# Patient Record
Sex: Male | Born: 1940 | Race: Black or African American | Hispanic: No | Marital: Married | Smoking: Never smoker
Health system: Southern US, Community
[De-identification: ages and names within clinical notes are randomized; demographics above are authoritative.]

## PROBLEM LIST (undated history)

## (undated) DIAGNOSIS — M1990 Unspecified osteoarthritis, unspecified site: Secondary | ICD-10-CM

## (undated) DIAGNOSIS — I4891 Unspecified atrial fibrillation: Secondary | ICD-10-CM

## (undated) DIAGNOSIS — I10 Essential (primary) hypertension: Secondary | ICD-10-CM

## (undated) DIAGNOSIS — D497 Neoplasm of unspecified behavior of endocrine glands and other parts of nervous system: Secondary | ICD-10-CM

## (undated) DIAGNOSIS — K219 Gastro-esophageal reflux disease without esophagitis: Secondary | ICD-10-CM

## (undated) DIAGNOSIS — N400 Enlarged prostate without lower urinary tract symptoms: Secondary | ICD-10-CM

## (undated) HISTORY — DX: Benign prostatic hyperplasia without lower urinary tract symptoms: N40.0

## (undated) HISTORY — DX: Neoplasm of unspecified behavior of endocrine glands and other parts of nervous system: D49.7

## (undated) HISTORY — DX: Essential (primary) hypertension: I10

## (undated) HISTORY — DX: Unspecified atrial fibrillation: I48.91

## (undated) HISTORY — PX: VEIN SURGERY: SHX48

---

## 2004-02-07 HISTORY — PX: COLONOSCOPY: SHX5424

## 2004-08-27 HISTORY — PX: TUMOR REMOVAL: SHX12

## 2005-08-27 HISTORY — PX: NASAL POLYP SURGERY: SHX186

## 2008-04-29 ENCOUNTER — Ambulatory Visit (HOSPITAL_COMMUNITY): Admission: RE | Admit: 2008-04-29 | Discharge: 2008-04-29 | Payer: Self-pay | Admitting: Family Medicine

## 2008-05-07 ENCOUNTER — Ambulatory Visit (HOSPITAL_COMMUNITY): Admission: RE | Admit: 2008-05-07 | Discharge: 2008-05-07 | Payer: Self-pay | Admitting: Family Medicine

## 2008-05-18 ENCOUNTER — Ambulatory Visit: Payer: Self-pay | Admitting: Thoracic Surgery

## 2008-05-21 ENCOUNTER — Ambulatory Visit (HOSPITAL_COMMUNITY): Admission: RE | Admit: 2008-05-21 | Discharge: 2008-05-21 | Payer: Self-pay | Admitting: Otolaryngology

## 2011-01-09 NOTE — Letter (Signed)
May 18, 2008   Angus G. Renard Matter, MD  [address]  [place], [place] [address]   Re:  KHASIR, WOODROME             DOB:  [birth year]   Dear Thalia Party:   I appreciate the opportunity to see this 70 year old African American  male who is having a large left supraclavicular fossa tumor.  It is  nonpainful, but he says it has gotten larger over the last 3 years.  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  The CT scan also showed  that he had some type of nasal polyp and he has been having trouble with  sinusitis.   PAST MEDICAL HISTORY:  Significant for hypertension, for which he takes  Avalide 150/12.5 daily.   FAMILY HISTORY:  Noncontributory.   SOCIAL HISTORY:  Married.  He has 3 children.  He is retired.  Does not  smoke but did smoke in the past.   REVIEW OF SYSTEMS:  Noncontributory.   PHYSICAL EXAMINATION:  VITAL SIGNS:  He is 200 pounds, he is 6 feet,  blood pressure is 158/94, pulse 50, respiration 18, and sats were 96%.  HEAD, EYES, EARS, NOSE AND THROAT:  Unremarkable.  There is no increased  nasal drainage from particularly in his nares.  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  CHEST:  Clear to auscultation and percussion.  HEART:  Regular sinus rhythm.  No murmurs.  ABDOMEN:  Soft.  There is no hepatosplenomegaly.  EXTREMITIES:  Pulses are 2+.  There is no clubbing or edema.  NEUROLOGIC:  He is oriented x3.   I discussed the situation with the patient as well as one of his  relatives who is an MD at [facility] and suggested that he see Dr. Zola Button T.  Wolicki about both of the problems.  We could take care of the lipoma  and I do think it needs to be excised as it will just continue to get  bigger and will probably start causing symptoms.  However, his sinus  problems are such that he needs a full ENT evaluation and thus  both of  this can be taken care by Dr. Lazarus Salines.  I have tentatively referred him  to Dr. Lazarus Salines.  If Dr. Lazarus Salines wants me to excise the lipoma, I will be  happy to do it any time.   I appreciate the opportunity to see the patient.   Ines Bloomer, M.D.  Electronically Signed   DPB/MEDQ  D:  05/18/2008  T:  05/19/2008  Job:  [id number]

## 2011-12-25 DIAGNOSIS — D353 Benign neoplasm of craniopharyngeal duct: Secondary | ICD-10-CM

## 2011-12-25 DIAGNOSIS — D352 Benign neoplasm of pituitary gland: Secondary | ICD-10-CM | POA: Diagnosis not present

## 2011-12-25 DIAGNOSIS — E237 Disorder of pituitary gland, unspecified: Secondary | ICD-10-CM | POA: Diagnosis not present

## 2012-06-06 ENCOUNTER — Ambulatory Visit (HOSPITAL_COMMUNITY)
Admission: RE | Admit: 2012-06-06 | Discharge: 2012-06-06 | Disposition: A | Discharge status: Home / Self Care | Payer: Medicare Other | Source: Ambulatory Visit | Attending: Family Medicine | Admitting: Family Medicine

## 2012-06-06 ENCOUNTER — Other Ambulatory Visit (HOSPITAL_COMMUNITY): Payer: Self-pay | Admitting: Family Medicine

## 2012-06-06 DIAGNOSIS — M542 Cervicalgia: Secondary | ICD-10-CM | POA: Diagnosis not present

## 2012-06-06 DIAGNOSIS — M25519 Pain in unspecified shoulder: Secondary | ICD-10-CM | POA: Diagnosis not present

## 2012-06-06 DIAGNOSIS — I10 Essential (primary) hypertension: Secondary | ICD-10-CM | POA: Diagnosis not present

## 2012-06-06 DIAGNOSIS — R52 Pain, unspecified: Secondary | ICD-10-CM

## 2012-06-06 DIAGNOSIS — M4802 Spinal stenosis, cervical region: Secondary | ICD-10-CM | POA: Diagnosis not present

## 2012-08-27 HISTORY — PX: TUMOR REMOVAL: SHX12

## 2012-12-12 ENCOUNTER — Emergency Department (HOSPITAL_COMMUNITY)
Admission: EM | Admit: 2012-12-12 | Discharge: 2012-12-12 | Disposition: A | Discharge status: Home / Self Care | Payer: Medicare Other | Attending: Emergency Medicine | Admitting: Emergency Medicine

## 2012-12-12 DIAGNOSIS — R1033 Periumbilical pain: Secondary | ICD-10-CM | POA: Diagnosis not present

## 2012-12-12 DIAGNOSIS — R300 Dysuria: Secondary | ICD-10-CM | POA: Diagnosis not present

## 2012-12-12 DIAGNOSIS — Z79899 Other long term (current) drug therapy: Secondary | ICD-10-CM | POA: Diagnosis not present

## 2012-12-12 DIAGNOSIS — R339 Retention of urine, unspecified: Secondary | ICD-10-CM | POA: Diagnosis not present

## 2012-12-12 DIAGNOSIS — N390 Urinary tract infection, site not specified: Secondary | ICD-10-CM | POA: Diagnosis not present

## 2012-12-12 LAB — URINALYSIS, MICROSCOPIC ONLY
Bilirubin Urine: NEGATIVE
Nitrite: NEGATIVE
Specific Gravity, Urine: 1.025 (ref 1.005–1.030)
pH: 6 (ref 5.0–8.0)

## 2012-12-12 MED ORDER — CIPROFLOXACIN HCL 500 MG PO TABS: 500.0000 mg | ORAL_TABLET | Freq: Two times a day (BID) | ORAL | Status: DC

## 2012-12-12 NOTE — ED Notes (Signed)
Snack and fluids provided for patient.

## 2012-12-12 NOTE — ED Notes (Signed)
Patient instructed on leg bag use and emptying.

## 2012-12-12 NOTE — ED Notes (Addendum)
Pt reports urinary urgency, burning with urination and increased frequency, however is unable to pass urine.  Also reports global abdominal "soreness."  Reports sweating at night for the last 2 days.  Also describes pain that radiates to the bilateral flank areas.

## 2012-12-12 NOTE — ED Provider Notes (Signed)
History     CSN: [id number]  Arrival date & time 12/12/12  1219   First MD Initiated Contact with Patient 12/12/12 1345      Chief Complaint  Patient presents with  . Urinary Retention  . Abdominal Pain    (Consider location/radiation/quality/duration/timing/severity/associated sxs/prior treatment) Patient is a 72 y.o. male presenting with abdominal pain. The history is provided by the patient (pt states he cannot urinate and his abd hurts and is distented).  Abdominal Pain Pain location:  Periumbilical Pain quality: aching   Pain radiates to:  Does not radiate Pain severity:  Moderate Onset quality:  Gradual Chronicity:  New Context: not alcohol use   Associated symptoms: no chest pain, no cough, no diarrhea, no fatigue and no hematuria     No past medical history on file.  No past surgical history on file.  No family history on file.  History  Substance Use Topics  . Smoking status: Not on file  . Smokeless tobacco: Not on file  . Alcohol Use: Not on file      Review of Systems  Constitutional: Negative for appetite change and fatigue.  HENT: Negative for congestion, sinus pressure and ear discharge.   Eyes: Negative for discharge.  Respiratory: Negative for cough.   Cardiovascular: Negative for chest pain.  Gastrointestinal: Positive for abdominal pain. Negative for diarrhea.  Genitourinary: Positive for decreased urine volume and difficulty urinating. Negative for frequency and hematuria.  Musculoskeletal: Negative for back pain.  Skin: Negative for rash.  Neurological: Negative for seizures and headaches.  Psychiatric/Behavioral: Negative for hallucinations.    Allergies  Review of patient's allergies indicates no known allergies.  Home Medications   Current Outpatient Rx  Name  Route  Sig  Dispense  Refill  . docusate sodium (COLACE) 100 MG capsule   Oral   Take 100 mg by mouth 2 (two) times daily as needed for constipation.         .  hydrochlorothiazide (MICROZIDE) 12.5 MG capsule   Oral   Take 12.5 mg by mouth daily.         Marland Kitchen lisinopril (PRINIVIL,ZESTRIL) 10 MG tablet   Oral   Take 10 mg by mouth daily.         Marland Kitchen OVER THE COUNTER MEDICATION   Oral   Take 1 capsule by mouth 2 (two) times daily with a meal. [employer] Super [employer] Enzymes         . ciprofloxacin (CIPRO) 500 MG tablet   Oral   Take 1 tablet (500 mg total) by mouth 2 (two) times daily.   14 tablet   0     BP 150/110  Pulse 78  Temp(Src) 98.1 F (36.7 C) (Oral)  Ht 6' (1.829 m)  Wt 200 lb (90.719 kg)  BMI 27.12 kg/m2  SpO2 100%  Physical Exam  Constitutional: He is oriented to person, place, and time. He appears well-developed.  HENT:  Head: Normocephalic.  Eyes: Conjunctivae and EOM are normal. No scleral icterus.  Neck: Neck supple. No thyromegaly present.  Cardiovascular: Normal rate and regular rhythm.  Exam reveals no gallop and no friction rub.   No murmur heard. Pulmonary/Chest: No stridor. He has no wheezes. He has no rales. He exhibits no tenderness.  Abdominal: He exhibits no distension. There is tenderness. There is no rebound.  Abdomin distended and tender  Musculoskeletal: Normal range of motion. He exhibits no edema.  Lymphadenopathy:    He has no cervical adenopathy.  Neurological: He  is oriented to person, place, and time. Coordination normal.  Skin: No rash noted. No erythema.  Psychiatric: He has a normal mood and affect. His behavior is normal.    ED Course  Procedures (including critical care time)  Labs Reviewed  URINALYSIS, MICROSCOPIC ONLY - Abnormal; Notable for the following:    Color, Urine STRAW (*)    Hgb urine dipstick LARGE (*)    Protein, ur 100 (*)    Urobilinogen, UA 2.0 (*)    Squamous Epithelial / LPF FEW (*)    Casts WBC CAST (*)    All other components within normal limits   No results found.   1. UTI (lower urinary tract infection)   2. Urinary retention       MDM  Urinary  retention        Benny Lennert, MD 12/12/12 [phone]

## 2012-12-12 NOTE — ED Notes (Signed)
The patient states that he is having difficulty urinating for the past 2 days, reports global abdominal soreness as well.

## 2012-12-12 NOTE — ED Notes (Signed)
Pt appears in no acute distress, reports pressure relief in bladder area with insertion of catheter.

## 2012-12-14 LAB — URINE CULTURE: Colony Count: 15000

## 2012-12-15 ENCOUNTER — Other Ambulatory Visit (HOSPITAL_COMMUNITY): Payer: Self-pay | Admitting: Urology

## 2012-12-15 ENCOUNTER — Telehealth (HOSPITAL_COMMUNITY): Payer: Self-pay | Admitting: Emergency Medicine

## 2012-12-15 DIAGNOSIS — R339 Retention of urine, unspecified: Secondary | ICD-10-CM | POA: Diagnosis not present

## 2012-12-15 NOTE — ED Notes (Signed)
+  Urine. Patient treated with Cipro. Sensitive to same. Per protocol MD. °

## 2012-12-15 NOTE — ED Notes (Signed)
Patient has +Urine culture. °

## 2012-12-17 ENCOUNTER — Ambulatory Visit (HOSPITAL_COMMUNITY)
Admission: RE | Admit: 2012-12-17 | Discharge: 2012-12-17 | Disposition: A | Discharge status: Home / Self Care | Payer: Medicare Other | Source: Ambulatory Visit | Attending: Urology | Admitting: Urology

## 2012-12-17 DIAGNOSIS — Q619 Cystic kidney disease, unspecified: Secondary | ICD-10-CM | POA: Insufficient documentation

## 2012-12-17 DIAGNOSIS — R339 Retention of urine, unspecified: Secondary | ICD-10-CM | POA: Diagnosis not present

## 2012-12-17 DIAGNOSIS — N281 Cyst of kidney, acquired: Secondary | ICD-10-CM | POA: Diagnosis not present

## 2012-12-29 DIAGNOSIS — N390 Urinary tract infection, site not specified: Secondary | ICD-10-CM | POA: Diagnosis not present

## 2012-12-29 DIAGNOSIS — N400 Enlarged prostate without lower urinary tract symptoms: Secondary | ICD-10-CM | POA: Diagnosis not present

## 2012-12-29 DIAGNOSIS — R339 Retention of urine, unspecified: Secondary | ICD-10-CM | POA: Diagnosis not present

## 2013-02-02 DIAGNOSIS — N400 Enlarged prostate without lower urinary tract symptoms: Secondary | ICD-10-CM | POA: Diagnosis not present

## 2013-02-04 NOTE — Patient Instructions (Addendum)
PATIENT INSTRUCTIONS POST-ANESTHESIA  IMMEDIATELY FOLLOWING SURGERY:  Do not drive or operate machinery for the first twenty four hours after surgery.  Do not make any important decisions for twenty four hours after surgery or while taking narcotic pain medications or sedatives.  If you develop intractable nausea and vomiting or a severe headache please notify your doctor immediately.  FOLLOW-UP:  Please make an appointment with your surgeon as instructed. You do not need to follow up with anesthesia unless specifically instructed to do so.  WOUND CARE INSTRUCTIONS (if applicable):  Keep a dry clean dressing on the ane   Travis Santos  02/04/2013   Your procedure is scheduled on:   02/10/2013  Report to [facility] at  745  AM.  Call this number if you have problems the morning of surgery: [phone]   Remember:   Do not eat food or drink liquids after midnight.   Take these medicines the morning of surgery with A SIP OF WATER: microzide, lisinopril    Do not wear jewelry, make-up or nail polish.  Do not wear lotions, powders, or perfumes.  Do not shave 48 hours prior to surgery. Men may shave face and neck.  Do not bring valuables to the hospital.  [facility] is not responsible  for any belongings or valuables.  Contacts, dentures or bridgework may not be worn into surgery.  Leave suitcase in the car. After surgery it may be brought to your room.  For patients admitted to the hospital, checkout time is 11:00 AM the day of discharge.   Patients discharged the day of surgery will not be allowed to drive  home.  Name and phone number of your driver: family  Special Instructions: Shower using CHG 2 nights before surgery and the night before surgery.  If you shower the day of surgery use CHG.  Use special wash - you have one bottle of CHG for all showers.  You should use approximately 1/3 of the bottle for each shower.   Please read over the following fact sheets that you were given:  Pain Booklet, Coughing and Deep Breathing, MRSA Information, Surgical Site Infection Prevention, Anesthesia Post-op Instructions and Care and Recovery After Surgery Transurethral Resection of the Prostate Transurethral resection of the prostate (TURP) is the removal of part of your prostate to treat noncancerous (benign) prostatic hyperplasia (BPH). BPH typically occurs in men older than 40 years. It is the abnormal growth of cells in your prostate. Specifically, it is an abnormal increase in the number of cells that make up your prostate tissue. This causes an increase in the size of your prostate. Often, in the case of BPH, the prostate becomes so large that it compresses the tube that drains urine out of your body from your bladder (urethra). Eventually, this compression can obstruct the flow of urine from your bladder. This obstruction can cause recurrent bladder infection and difficulties with bladder control and bladder emptying. The goal of TURP is to remove enough prostate tissue to allow for an unobstructed flow of urine, which often resolves the associated conditions. LET YOUR CAREGIVER KNOW ABOUT:  Any allergies you have.  Any medicines you are taking, including herbs, eye drops, over-the-counter medicines, and creams.  Any problems you have had with the use of anesthetics.  Any blood disorders you have, including bleeding problems and clotting problems.  Previous surgeries you have had.  Any prostate infections you have had. RISKS AND COMPLICATIONS Generally, TURP is a safe procedure. However,  as with any surgical procedure, complications can occur. Possible complications associated with TURP include:  Difficulty getting an erection.  Scarring, which may cause problems with the flow of your urine.  Injury to your urethra.  Incontinence from injury to the muscle that surrounds your prostate, which controls urine flow.  Infection.  Bleeding.  Injury to your bladder  (rare). BEFORE THE PROCEDURE  Your caregiver will tell you when you need to stop eating and drinking. If you take any medicines, your caregiver will tell you which ones you may keep taking and which ones you will have to stop taking and when.  Just before the procedure you will also receive medicine to make you fall asleep (general anesthetic). This will be given through a tube that is inserted into one of your veins (intravenous [IV] tube). PROCEDURE Your surgeon inserts an instrument that is similar to a telescope with an electric cutting edge (resectoscope) through your urethra to the area of the prostate gland. The cutting edge is used to remove enlarged pieces of your prostate, one piece at a time. At the end of your procedure, a flexible tube (catheter) will be inserted into your urethra to drain your bladder. Special plastic bags filled with solution will be connected to the end of the catheter. The solution will be used to irrigate blood from your bladder while you heal.  AFTER THE PROCEDURE You will be taken to the recovery area. Once you are awake, stable, and taking fluids well, you will be taken to your hospital room. Typically, you will stay in the hospital 1 2 days after this procedure. The catheter usually is removed before discharge from the hospital. Document Released: 08/13/2005 Document Revised: 05/07/2012 Document Reviewed: 01/14/2012 [facility] Patient Information 2014 [place], [place]. hesia/puncture wound site if there is drainage.  Once the wound has quit draining you may leave it open to air.  Generally you should leave the bandage intact for twenty four hours unless there is drainage.  If the epidural site drains for more than 36-48 hours please call the anesthesia department.  QUESTIONS?:  Please feel free to call your physician or the hospital operator if you have any questions, and they will be happy to assist you.

## 2013-02-05 ENCOUNTER — Encounter (HOSPITAL_COMMUNITY)
Admission: RE | Admit: 2013-02-05 | Discharge: 2013-02-05 | Disposition: A | Discharge status: Home / Self Care | Payer: Medicare Other | Source: Ambulatory Visit | Attending: Urology | Admitting: Urology

## 2013-02-05 ENCOUNTER — Encounter (HOSPITAL_COMMUNITY): Payer: Self-pay

## 2013-02-05 ENCOUNTER — Other Ambulatory Visit: Payer: Self-pay

## 2013-02-05 DIAGNOSIS — Z5309 Procedure and treatment not carried out because of other contraindication: Secondary | ICD-10-CM | POA: Diagnosis not present

## 2013-02-05 DIAGNOSIS — Z01810 Encounter for preprocedural cardiovascular examination: Secondary | ICD-10-CM | POA: Insufficient documentation

## 2013-02-05 DIAGNOSIS — N400 Enlarged prostate without lower urinary tract symptoms: Secondary | ICD-10-CM | POA: Insufficient documentation

## 2013-02-05 HISTORY — DX: Gastro-esophageal reflux disease without esophagitis: K21.9

## 2013-02-05 NOTE — Progress Notes (Signed)
02/05/13 1045 02/05/13 1107  Vitals  Temp 98 F (36.7 C) --   Temp src Oral --   Pulse Rate 63 --   Pulse Rate Source Dinamap --   Resp 16 --   BP ! 206/152 mmHg ! 166/100 mmHg  SpO2 100 % --     Dr. Renard Matter notified of new onset afib and acute hypertension. Pt will see MD in office.

## 2013-02-05 NOTE — Progress Notes (Signed)
Pt seen for preop visit for TURP scheduled on 02/10/2013. BP 206/152 on arrival and 166/100 after 15 minutes.  EKG performed.  Pt is in Afib.  Dr, Marcos Eke notified.  Dr. Renard Matter contacted and pt will immediately report to his office for treatment. Pt otherwise stable at present time.Procedure cancelled until cardiac clearance.

## 2013-02-10 ENCOUNTER — Encounter (HOSPITAL_COMMUNITY): Admission: RE | Payer: Self-pay | Source: Ambulatory Visit

## 2013-02-10 ENCOUNTER — Ambulatory Visit (HOSPITAL_COMMUNITY): Admission: RE | Admit: 2013-02-10 | Payer: Medicare Other | Source: Ambulatory Visit | Admitting: Urology

## 2013-02-10 SURGERY — TURP (TRANSURETHRAL RESECTION OF PROSTATE)
Anesthesia: Choice

## 2013-02-23 DIAGNOSIS — I4891 Unspecified atrial fibrillation: Secondary | ICD-10-CM | POA: Diagnosis not present

## 2013-03-11 ENCOUNTER — Encounter: Payer: Self-pay | Admitting: *Deleted

## 2013-03-12 ENCOUNTER — Encounter: Payer: Self-pay | Admitting: Cardiology

## 2013-03-12 ENCOUNTER — Ambulatory Visit (INDEPENDENT_AMBULATORY_CARE_PROVIDER_SITE_OTHER): Payer: Medicare Other | Admitting: Cardiology

## 2013-03-12 VITALS — BP 154/100 | HR 56 | Ht 72.0 in | Wt 201.0 lb

## 2013-03-12 DIAGNOSIS — I10 Essential (primary) hypertension: Secondary | ICD-10-CM | POA: Diagnosis not present

## 2013-03-12 DIAGNOSIS — I4891 Unspecified atrial fibrillation: Secondary | ICD-10-CM | POA: Diagnosis not present

## 2013-03-12 DIAGNOSIS — Z01810 Encounter for preprocedural cardiovascular examination: Secondary | ICD-10-CM | POA: Insufficient documentation

## 2013-03-12 DIAGNOSIS — I482 Chronic atrial fibrillation, unspecified: Secondary | ICD-10-CM | POA: Insufficient documentation

## 2013-03-12 LAB — BASIC METABOLIC PANEL
Calcium: 9.5 mg/dL (ref 8.4–10.5)
Glucose, Bld: 90 mg/dL (ref 70–99)
Potassium: 3.5 mEq/L (ref 3.5–5.3)
Sodium: 142 mEq/L (ref 135–145)

## 2013-03-12 NOTE — Assessment & Plan Note (Signed)
As outlined above, plan is to follow up with an echocardiogram and a BMET. May further advance lisinopril, although would not necessarily anticipate further cardiac testing unless significant abnormalities are noted on echocardiogram.

## 2013-03-12 NOTE — Patient Instructions (Addendum)
Your physician recommends that you schedule a follow-up appointment in: TO BE DETERMINED  Your physician has requested that you have an echocardiogram. Echocardiography is a painless test that uses sound waves to create images of your heart. It provides your doctor with information about the size and shape of your heart and how well your heart's chambers and valves are working. This procedure takes approximately one hour. There are no restrictions for this procedure.WE WILL CALL YOU WITH THE RESULTS  Your physician recommends that you return for lab work in: TODAY Counsellor GIVEN FOR BMET)

## 2013-03-12 NOTE — Assessment & Plan Note (Signed)
Rate looks to be reasonably controlled on no specific medical treatment. He reports no symptoms of palpitations, breathlessness, or chest pain. ECG shows probable repolarization abnormalities, presumably due to associated hypertensive heart disease with LVH. CHADS2 score is one. At this point would recommend an echocardiogram to assess cardiac structure and function. Unless there are significant abnormalities that require further workup, he should be able to proceed elective TURP at an acceptable perioperative cardiac risk as it relates to the atrial fibrillation. Heart rate needs monitoring throughout. Would not add beta blocker due to bradycardia. No plan to pursue anticoagulation or cardioversion at this time, particularly in light of pending surgery.

## 2013-03-12 NOTE — Assessment & Plan Note (Signed)
Blood pressure is not well controlled based on record review. Recheck today by me was 150/98. Fairly equal both arms. Have recommended a BMET. We will likely push lisinopril higher to 20 mg twice daily.

## 2013-03-12 NOTE — Progress Notes (Signed)
Clinical Summary Travis Santos is a 72 y.o.male referred for cardiology consultation by Dr. Renard Matter. He is being considered for elective prostate surgery, was canceled a few weeks ago due to elevated blood pressure and finding of atrial fibrillation. Patient does not report any sense of palpitations, no dizziness or syncope, no limiting breathlessness or chest pain. 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 Duration of atrial fibrillation is not certain. Records do show the patient mentions not taking his medications regularly. Blood pressure has been elevated. I see that lisinopril dose was increased recently by Dr. Renard Matter. No recent BMET. He has an indwelling Foley catheter.  ECG from June reviewed showing atrial fibrillation with controlled ventricular response, LVH with repolarization abnormalities. ECG today shows similar findings.  No prior echocardiogram for assessment of cardiac structure and function.   No Known Allergies  Current Outpatient Prescriptions  Medication Sig Dispense Refill  . docusate sodium (COLACE) 100 MG capsule Take 100 mg by mouth 2 (two) times daily as needed for constipation.      . hydrochlorothiazide (MICROZIDE) 12.5 MG capsule Take 12.5 mg by mouth daily.      Marland Kitchen lisinopril (PRINIVIL,ZESTRIL) 20 MG tablet Take 20 mg by mouth daily.      Marland Kitchen OVER THE COUNTER MEDICATION Take 1 capsule by mouth 2 (two) times daily with a meal. [employer] Super [employer]ive Enzymes       No current facility-administered medications for this visit.    Past Medical History  Diagnosis Date  . Essential hypertension, benign   . GERD (gastroesophageal reflux disease)   . Atrial fibrillation   . Prostatic hypertrophy     Past Surgical History  Procedure Laterality Date  . Nasal polyp surgery  2007  . Tumor removal  2006    Tumor removed from left clavical area    Family History  Problem Relation Age of Onset  . Hypertension Mother    . Hypertension Father     Social History Travis Santos reports that he has never smoked. He does not have any smokeless tobacco history on file. Travis Santos reports that he does not drink alcohol.  Review of Systems Reports stable appetite. No orthopnea or PND, no leg edema. Did have recent UTI with Foley catheter in place. Otherwise negative.  Physical Examination Filed Vitals:   03/12/13 0822  BP: 154/100  Pulse: 56   Filed Weights   03/12/13 0822  Weight: 201 lb (91.173 kg)   Normally nourished appearing, comfortable at rest. HEENT: Conjunctiva and lids normal, oropharynx clear. Neck: Supple, no elevated JVP or carotid bruits, no thyromegaly. Lungs: Clear to auscultation, nonlabored breathing at rest. Cardiac: Irregularly irregular, no S3 or significant systolic murmur, no pericardial rub. Abdomen: Soft, nontender, bowel sounds present, no guarding or rebound. Extremities: No pitting edema, distal pulses 2+. Foley catheter leg bag in place. Skin: Warm and dry. Musculoskeletal: No kyphosis. Neuropsychiatric: Alert and oriented x3, affect grossly appropriate.   Problem List and Plan   Atrial fibrillation Rate looks to be reasonably controlled on no specific medical treatment. He reports no symptoms of palpitations, breathlessness, or chest pain. ECG shows probable repolarization abnormalities, presumably due to associated hypertensive heart disease with LVH. CHADS2 score is one. At this point would recommend an echocardiogram to assess cardiac structure and function. Unless there are significant abnormalities that require further workup, he should be able to proceed elective TURP at an acceptable perioperative cardiac risk as it relates to the atrial  fibrillation. Heart rate needs monitoring throughout. Would not add beta blocker due to bradycardia. No plan to pursue anticoagulation or cardioversion at this time, particularly in light of pending surgery.  Essential hypertension,  benign Blood pressure is not well controlled based on record review. Recheck today by me was 150/98. Fairly equal both arms. Have recommended a BMET. We will likely push lisinopril higher to 20 mg twice daily.  Preoperative cardiovascular examination As outlined above, plan is to follow up with an echocardiogram and a BMET. May further advance lisinopril, although would not necessarily anticipate further cardiac testing unless significant abnormalities are noted on echocardiogram.    Jonelle Sidle, M.D., F.A.C.C.

## 2013-03-13 MED ORDER — LISINOPRIL 20 MG PO TABS: 20.0000 mg | ORAL_TABLET | Freq: Two times a day (BID) | ORAL | Status: DC

## 2013-03-13 NOTE — Addendum Note (Signed)
Addended by: Thompson Grayer on: 03/13/2013 05:22 PM   Modules accepted: Orders

## 2013-03-20 ENCOUNTER — Ambulatory Visit (HOSPITAL_COMMUNITY)
Admission: RE | Admit: 2013-03-20 | Discharge: 2013-03-20 | Disposition: A | Discharge status: Home / Self Care | Payer: Medicare Other | Source: Ambulatory Visit | Attending: Cardiology | Admitting: Cardiology

## 2013-03-20 DIAGNOSIS — I10 Essential (primary) hypertension: Secondary | ICD-10-CM | POA: Diagnosis not present

## 2013-03-20 DIAGNOSIS — I4891 Unspecified atrial fibrillation: Secondary | ICD-10-CM | POA: Insufficient documentation

## 2013-03-20 NOTE — Progress Notes (Signed)
*  PRELIMINARY RESULTS* Echocardiogram 2D Echocardiogram has been performed.  Travis Santos 03/20/2013, 1:31 PM

## 2013-03-26 ENCOUNTER — Encounter: Payer: Self-pay | Admitting: *Deleted

## 2013-03-26 ENCOUNTER — Other Ambulatory Visit (HOSPITAL_COMMUNITY): Payer: Self-pay | Admitting: Urology

## 2013-03-26 DIAGNOSIS — N433 Hydrocele, unspecified: Secondary | ICD-10-CM | POA: Diagnosis not present

## 2013-03-26 DIAGNOSIS — N400 Enlarged prostate without lower urinary tract symptoms: Secondary | ICD-10-CM | POA: Diagnosis not present

## 2013-03-26 DIAGNOSIS — R339 Retention of urine, unspecified: Secondary | ICD-10-CM | POA: Diagnosis not present

## 2013-03-30 ENCOUNTER — Emergency Department (HOSPITAL_COMMUNITY)
Admission: EM | Admit: 2013-03-30 | Discharge: 2013-03-30 | Disposition: A | Discharge status: Home / Self Care | Payer: Medicare Other | Attending: Emergency Medicine | Admitting: Emergency Medicine

## 2013-03-30 ENCOUNTER — Emergency Department (HOSPITAL_COMMUNITY): Payer: Medicare Other

## 2013-03-30 ENCOUNTER — Encounter (HOSPITAL_COMMUNITY): Payer: Self-pay | Admitting: Emergency Medicine

## 2013-03-30 DIAGNOSIS — I10 Essential (primary) hypertension: Secondary | ICD-10-CM | POA: Diagnosis not present

## 2013-03-30 DIAGNOSIS — Z87448 Personal history of other diseases of urinary system: Secondary | ICD-10-CM | POA: Diagnosis not present

## 2013-03-30 DIAGNOSIS — Z8679 Personal history of other diseases of the circulatory system: Secondary | ICD-10-CM | POA: Diagnosis not present

## 2013-03-30 DIAGNOSIS — Z8719 Personal history of other diseases of the digestive system: Secondary | ICD-10-CM | POA: Diagnosis not present

## 2013-03-30 DIAGNOSIS — N433 Hydrocele, unspecified: Secondary | ICD-10-CM | POA: Insufficient documentation

## 2013-03-30 DIAGNOSIS — Z79899 Other long term (current) drug therapy: Secondary | ICD-10-CM | POA: Diagnosis not present

## 2013-03-30 DIAGNOSIS — N509 Disorder of male genital organs, unspecified: Secondary | ICD-10-CM | POA: Insufficient documentation

## 2013-03-30 DIAGNOSIS — I4891 Unspecified atrial fibrillation: Secondary | ICD-10-CM | POA: Diagnosis not present

## 2013-03-30 DIAGNOSIS — N390 Urinary tract infection, site not specified: Secondary | ICD-10-CM | POA: Diagnosis not present

## 2013-03-30 LAB — URINE MICROSCOPIC-ADD ON

## 2013-03-30 LAB — URINALYSIS, ROUTINE W REFLEX MICROSCOPIC
Glucose, UA: NEGATIVE mg/dL
Nitrite: POSITIVE — AB
Specific Gravity, Urine: 1.015 (ref 1.005–1.030)
pH: 7 (ref 5.0–8.0)

## 2013-03-30 LAB — POCT I-STAT, CHEM 8
Chloride: 104 mEq/L (ref 96–112)
Glucose, Bld: 119 mg/dL — ABNORMAL HIGH (ref 70–99)
HCT: 43 % (ref 39.0–52.0)
Potassium: 3 mEq/L — ABNORMAL LOW (ref 3.5–5.1)
Sodium: 142 mEq/L (ref 135–145)

## 2013-03-30 MED ORDER — POTASSIUM CHLORIDE CRYS ER 20 MEQ PO TBCR
40.0000 meq | EXTENDED_RELEASE_TABLET | Freq: Once | ORAL | Status: AC
  Administered 2013-03-30: 40 meq via ORAL
  Filled 2013-03-30: qty 2

## 2013-03-30 MED ORDER — CIPROFLOXACIN HCL 500 MG PO TABS: 500.0000 mg | ORAL_TABLET | Freq: Two times a day (BID) | ORAL | Status: DC

## 2013-03-30 NOTE — ED Provider Notes (Signed)
CSN: [id number]     Arrival date & time 03/30/13  1030 History  This chart was scribed for Glynn Octave, MD by Bennett Scrape, ED Scribe. This patient was seen in room APA19/APA19 and the patient's care was started at 11:05 AM.   First MD Initiated Contact with Patient 03/30/13 1101     Chief Complaint  Patient presents with  . Groin Swelling  . Testicle Pain    The history is provided by the patient. No language interpreter was used.    HPI Comments: Travis Santos is a 72 y.o. male who presents to the Emergency Department complaining of one week of gradual onset, gradually worsening, constant right testicle swelling with associated swelling. He reports that he has been moving his bowels normally and has been eating and drinking normally since the onset. He reports having a indwelling foley cath placed 2 to 3 months ago. He states that he has been following up with Dr. Jerre Simon with no complications. He was seen 4 days ago by Dr. Jerre Simon and was told that the symptoms were due to fluid build up. He reports that he has an [place] scheduled in 3 days to investigate the symptoms. He has a h/o prostatic hypertrophy and states that he has not had prostate surgery yet due to A. Fib. He is still following up with Dr. Jerre Simon for the same but does not have a date scheduled yet. He also c/o uncontrolled HTN. He had his lisinopril increased from 10 mg to 20 mg one month ago by his PCP with no improvement. He denies any missed doses. BP [place] 153/99 in the ED. He denies fevers, CP, emesis and hematuria as associated symptoms.   Past Medical History  Diagnosis Date  . Essential hypertension, benign   . GERD (gastroesophageal reflux disease)   . Atrial fibrillation   . Prostatic hypertrophy    Past Surgical History  Procedure Laterality Date  . Nasal polyp surgery  2007  . 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 Hypertension  Mother   . Hypertension Father    History  Substance Use Topics  . Smoking status: Never Smoker   . Smokeless tobacco: Not on file  . Alcohol Use: No    Review of Systems  A complete 10 system review of systems was obtained and all systems are negative except as noted in the HPI and PMH.   Allergies  Review of patient's allergies indicates no known allergies.  Home Medications   Current Outpatient Rx  Name  Route  Sig  Dispense  Refill  . docusate sodium (COLACE) 100 MG capsule   Oral   Take 100 mg by mouth daily.         . hydrochlorothiazide (MICROZIDE) 12.5 MG capsule   Oral   Take 12.5 mg by mouth daily.         Marland Kitchen lisinopril (PRINIVIL,ZESTRIL) 20 MG tablet   Oral   Take 20 mg by mouth daily.         . ciprofloxacin (CIPRO) 500 MG tablet   Oral   Take 1 tablet (500 mg total) by mouth every 12 (twelve) hours.   14 tablet   0    Triage Vitals: BP 153/99  Pulse 79  Temp(Src) 97.9 F (36.6 C)  Resp 20  Wt 201 lb (91.173 kg)  BMI 27.25 kg/m2  SpO2 100%  Physical Exam  Nursing note and vitals reviewed. Constitutional: He is oriented to person, place, and time. He appears well-developed and well-nourished. No distress.  HENT:  Head: Normocephalic and atraumatic.  Eyes: Conjunctivae and EOM are normal.  Neck: Normal range of motion. Neck supple. No tracheal deviation present.  Cardiovascular: Normal rate, regular rhythm and normal heart sounds.   No murmur heard. Pulmonary/Chest: Effort normal and breath sounds normal. No respiratory distress. He has no wheezes. He has no rales.  Abdominal: Soft. Bowel sounds are normal. There is no tenderness.  Genitourinary:  uncircumcised penis with foley in place, no drainage, left testicle is normal and non-tender, in place of right testicle their is a large cystic structure 8 by 10 cm that is firm and tender, unable tot palpate right testicle, rectal vault is empty, small external hemorrhoids, no gross blood,  non-tender prostate, chaperone present  Musculoskeletal: Normal range of motion. He exhibits no edema.  Neurological: He is alert and oriented to person, place, and time. No cranial nerve deficit.  Skin: Skin is warm and dry.  Psychiatric: He has a normal mood and affect. His behavior is normal.    ED Course   Procedures (including critical care time)  DIAGNOSTIC STUDIES: Oxygen Saturation is 100% on room air, normal by my interpretation.    COORDINATION OF CARE: 11:08 AM-Discussed treatment plan which includes US of the scrotum and abdomen, UA and I-stat with pt at bedside and pt agreed to plan.  12:16 PM-Informed pt of radiology and lab work results showing a hydrocele in the right testicle. Informed pt that this will not affect the blood flow to the testicle and that symptoms may improve on their own. Will check an urine sample before discharge. Discussed discharge plan which includes scrotal support and pain medications with pt and pt agreed to plan. Also advised pt to follow up with Dr. Jerre Simon as needed and pt agreed. Addressed symptoms to return for with pt.   Labs Reviewed  URINALYSIS, ROUTINE W REFLEX MICROSCOPIC - Abnormal; Notable for the following:    APPearance HAZY (*)    Hgb urine dipstick LARGE (*)    Protein, ur TRACE (*)    Nitrite POSITIVE (*)    Leukocytes, UA LARGE (*)    All other components within normal limits  URINE MICROSCOPIC-ADD ON - Abnormal; Notable for the following:    Squamous Epithelial / LPF FEW (*)    Bacteria, UA MANY (*)    All other components within normal limits  POCT I-STAT, CHEM 8 - Abnormal; Notable for the following:    Potassium 3.0 (*)    Creatinine, Ser 1.50 (*)    Glucose, Bld 119 (*)    All other components within normal limits  URINE CULTURE   US Scrotum  03/30/2013   *RADIOLOGY REPORT*  Clinical Data:  Right testicular swelling, pain.  SCROTAL ULTRASOUND DOPPLER ULTRASOUND OF THE TESTICLES  Technique: Complete ultrasound  examination of the testicles, epididymis, and other scrotal structures was performed.  Color and spectral Doppler ultrasound were also utilized to evaluate blood flow to the testicles.  Comparison:  None  Findings:  Right testis:  5.7 x 3.1 x 3.2 cm.  Normal echotexture.  No focal abnormality.  Normal arterial and venous blood flow.  Left testis:  4.0 x 2.7 x 3.0 cm.  Normal echotexture.  No focal abnormality.  Normal arterial venous blood flow.  Right epididymis:  Not visualized.  Left epididymis:  Normal in size and appearance.  Hydrocele:  Large  bilateral.  Scattered septations.  Varicocele:  Absent  Pulsed Doppler interrogation of both testes demonstrates low resistance flow bilaterally.  IMPRESSION: Large bilateral septated hydroceles.  No testicular abnormality.  No torsion.   Original Report Authenticated By: Charlett Nose, M.D.   [place] Art/ven Flow Abd Pelv Doppler  03/30/2013   *RADIOLOGY REPORT*  Clinical Data:  Right testicular swelling, pain.  SCROTAL ULTRASOUND DOPPLER ULTRASOUND OF THE TESTICLES  Technique: Complete ultrasound examination of the testicles, epididymis, and other scrotal structures was performed.  Color and spectral Doppler ultrasound were also utilized to evaluate blood flow to the testicles.  Comparison:  None  Findings:  Right testis:  5.7 x 3.1 x 3.2 cm.  Normal echotexture.  No focal abnormality.  Normal arterial and venous blood flow.  Left testis:  4.0 x 2.7 x 3.0 cm.  Normal echotexture.  No focal abnormality.  Normal arterial venous blood flow.  Right epididymis:  Not visualized.  Left epididymis:  Normal in size and appearance.  Hydrocele:  Large bilateral.  Scattered septations.  Varicocele:  Absent  Pulsed Doppler interrogation of both testes demonstrates low resistance flow bilaterally.  IMPRESSION: Large bilateral septated hydroceles.  No testicular abnormality.  No torsion.   Original Report Authenticated By: Charlett Nose, M.D.   1. Bilateral hydrocele   2. Urinary tract  infection     MDM  History of what sounds like BPH presenting with one week of right-sided testicular pain and swelling. Saw Dr. Jerre Simon is scheduled for an ultrasound. Denies any difficulty with urination. Denies abdominal pain, fevers or vomiting.  Exam shows markedly large structure in the right hemiscrotum.  Scrotal ultrasound shows large bilateral hydroceles. No torsion or testicular abnormality. UA appears infected.  Cath sample. Culture sent. cipro started. Recommend scrotal support and followup with Dr. Jerre Simon as scheduled.   Date: 03/30/2013  Rate: 63  Rhythm: atrial fibrillation  QRS Axis: normal  Intervals: normal  ST/T Wave abnormalities: nonspecific ST/T changes  Conduction Disutrbances:none  Narrative Interpretation: Unchanged inferior lateral T-wave inversions and ST depressions  Old EKG Reviewed: unchanged  BP 158/102  Pulse 62  Temp(Src) 97.9 F (36.6 C)  Resp 14  Wt 201 lb (91.173 kg)  BMI 27.25 kg/m2  SpO2 97%  I personally performed the services described in this documentation, which was scribed in my presence. The recorded information has been reviewed and is accurate.    Glynn Octave, MD 03/30/13 [phone]

## 2013-03-30 NOTE — ED Notes (Signed)
Patient with no complaints at this time. Respirations even and unlabored. Skin warm/dry. Discharge instructions reviewed with patient at this time. Patient given opportunity to voice concerns/ask questions. Patient discharged at this time and left Emergency Department with steady gait.   

## 2013-03-30 NOTE — ED Notes (Signed)
MD at bedside. 

## 2013-03-30 NOTE — ED Notes (Signed)
States that he is having right testicle pain and swelling.  States that he is concerned with his blood pressure running high as well.  States that he has an indwelling foley and is passing urine fine.

## 2013-03-30 NOTE — ED Notes (Signed)
Scrotal support unavailable in hospital. Patient informed he could purchase one at the pharmacy.

## 2013-04-01 DIAGNOSIS — N433 Hydrocele, unspecified: Secondary | ICD-10-CM | POA: Diagnosis not present

## 2013-04-01 DIAGNOSIS — N400 Enlarged prostate without lower urinary tract symptoms: Secondary | ICD-10-CM | POA: Diagnosis not present

## 2013-04-01 DIAGNOSIS — R339 Retention of urine, unspecified: Secondary | ICD-10-CM | POA: Diagnosis not present

## 2013-04-02 ENCOUNTER — Ambulatory Visit (HOSPITAL_COMMUNITY): Admission: RE | Admit: 2013-04-02 | Payer: Medicare Other | Source: Ambulatory Visit

## 2013-04-02 ENCOUNTER — Other Ambulatory Visit (HOSPITAL_COMMUNITY): Payer: Self-pay | Admitting: Urology

## 2013-04-02 DIAGNOSIS — N400 Enlarged prostate without lower urinary tract symptoms: Secondary | ICD-10-CM

## 2013-04-03 LAB — URINE CULTURE

## 2013-04-04 ENCOUNTER — Telehealth (HOSPITAL_COMMUNITY): Payer: Self-pay | Admitting: Emergency Medicine

## 2013-04-04 NOTE — ED Notes (Signed)
Post ED Visit - Positive Culture Follow-up  Culture report reviewed by antimicrobial stewardship pharmacist: []  Wes Dulaney, Pharm.D., BCPS []  Celedonio Miyamoto, Pharm.D., BCPS []  Georgina Pillion, Pharm.D., BCPS []  [place], [place].D., BCPS, AAHIVP [x]  Estella Husk, Pharm.D., BCPS, AAHIVP  Positive urine culture Treated with Cipro, organism sensitive to the same and no further patient follow-up is required at this time.  Kylie A Holland 04/04/2013, 5:03 PM

## 2013-04-07 ENCOUNTER — Ambulatory Visit (INDEPENDENT_AMBULATORY_CARE_PROVIDER_SITE_OTHER): Payer: Medicare Other | Admitting: Internal Medicine

## 2013-04-07 ENCOUNTER — Encounter: Payer: Self-pay | Admitting: Internal Medicine

## 2013-04-07 VITALS — BP 160/95 | HR 70 | Temp 98.1°F | Resp 16 | Ht 72.0 in | Wt 201.0 lb

## 2013-04-07 DIAGNOSIS — N453 Epididymo-orchitis: Secondary | ICD-10-CM

## 2013-04-07 DIAGNOSIS — N451 Epididymitis: Secondary | ICD-10-CM

## 2013-04-07 DIAGNOSIS — N400 Enlarged prostate without lower urinary tract symptoms: Secondary | ICD-10-CM

## 2013-04-07 DIAGNOSIS — I10 Essential (primary) hypertension: Secondary | ICD-10-CM | POA: Diagnosis not present

## 2013-04-07 MED ORDER — AMLODIPINE BESYLATE 5 MG PO TABS: 5.0000 mg | ORAL_TABLET | Freq: Every day | ORAL | Status: DC

## 2013-04-07 NOTE — Progress Notes (Signed)
Blood pressure was 170 palpated

## 2013-04-09 ENCOUNTER — Other Ambulatory Visit: Payer: Self-pay | Admitting: Internal Medicine

## 2013-04-09 DIAGNOSIS — I10 Essential (primary) hypertension: Secondary | ICD-10-CM

## 2013-04-10 ENCOUNTER — Ambulatory Visit (INDEPENDENT_AMBULATORY_CARE_PROVIDER_SITE_OTHER): Payer: Medicare Other | Admitting: Urology

## 2013-04-10 DIAGNOSIS — N401 Enlarged prostate with lower urinary tract symptoms: Secondary | ICD-10-CM

## 2013-04-10 DIAGNOSIS — R338 Other retention of urine: Secondary | ICD-10-CM | POA: Diagnosis not present

## 2013-04-10 DIAGNOSIS — N453 Epididymo-orchitis: Secondary | ICD-10-CM

## 2013-04-10 DIAGNOSIS — N433 Hydrocele, unspecified: Secondary | ICD-10-CM | POA: Diagnosis not present

## 2013-04-14 ENCOUNTER — Encounter: Payer: Self-pay | Admitting: Internal Medicine

## 2013-04-14 ENCOUNTER — Ambulatory Visit (INDEPENDENT_AMBULATORY_CARE_PROVIDER_SITE_OTHER): Payer: Medicare Other | Admitting: Internal Medicine

## 2013-04-14 VITALS — BP 163/112 | HR 123 | Temp 97.6°F | Resp 16 | Ht 70.25 in | Wt 200.0 lb

## 2013-04-14 DIAGNOSIS — I10 Essential (primary) hypertension: Secondary | ICD-10-CM | POA: Diagnosis not present

## 2013-04-14 DIAGNOSIS — D352 Benign neoplasm of pituitary gland: Secondary | ICD-10-CM | POA: Diagnosis not present

## 2013-04-14 NOTE — Progress Notes (Signed)
  Subjective:    Patient ID: Travis Santos, male    DOB: 1940/12/11, 72 y.o.   MRN: [id number]  Hypertension  : Pt here for follow-up on HTN after initiation of new BP medication. Pt has not had any symptoms of dizziness.   Pt  has a history of Pituitary Macroadenoma that is being followed Dr. Angelyn Punt at [facility] by MRI periodically. Next MRI is scheduled for October 2014.  Pt was also seen by Urology after having a foley catheter indwelling for 3 months. He underwent a  Cystoscopy and the Catheter was discontinued. He reports urgency but no frequency and no symptoms of retention. He has completed his antibiotics on Monday but thinks that he was supposed to have an additional prescription for antibiotics which he has not yet received.      Review of Systems  Constitutional: Negative.   HENT: Negative.   Eyes: Negative.   Respiratory: Negative.   Cardiovascular: Negative.   Gastrointestinal: Negative.   Endocrine: Negative.   Genitourinary: Positive for urgency and scrotal swelling. Negative for frequency and hematuria.  Skin: Negative.   Allergic/Immunologic: Negative.   Neurological: Negative.  Negative for dizziness, syncope and light-headedness.  Hematological: Negative.   Psychiatric/Behavioral: Negative.        Objective:   Physical Exam  Constitutional: He is oriented to person, place, and time. He appears well-developed and well-nourished.  HENT:  Head: Normocephalic and atraumatic.  Eyes: Conjunctivae and EOM are normal. Pupils are equal, round, and reactive to light.  Neck: Normal range of motion. Neck supple. No tracheal deviation present.  Cardiovascular: Normal rate, regular rhythm and normal heart sounds.  Exam reveals no gallop and no friction rub.   No murmur heard. Pulmonary/Chest: Effort normal and breath sounds normal.  Abdominal: Soft. Bowel sounds are normal.  Genitourinary:  Right sided scrotal swelling.  Musculoskeletal: Normal  range of motion.  Lymphadenopathy:    He has no cervical adenopathy.  Neurological: He is alert and oriented to person, place, and time. He has normal reflexes.  Skin: Skin is warm and dry.  Psychiatric: He has a normal mood and affect. His behavior is normal. Judgment and thought content normal.          Assessment & Plan:  1. HTN: BP improved with the addition of Norvasc. Orthostatics negative. Pt denies any dizziness or signs of orthopnea. Will continue current medications and reassess BP in 2 weeks  2. Urinary retention: Pt s/p cystoscopy and removal of foley catheter. He denies any difficulty with urination but has noticed no difference with the hydrocele. He has af/u appointment on 8/29 2014 with Dr. Annabell Howells.  3. Pituitary Macroadenoma: Pt follows with Dr. Angelyn Punt at [facility]. Will obtain records. Pt denies any changes in vision.   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 Will obtain records.

## 2013-04-15 DIAGNOSIS — D352 Benign neoplasm of pituitary gland: Secondary | ICD-10-CM | POA: Insufficient documentation

## 2013-04-22 DIAGNOSIS — N451 Epididymitis: Secondary | ICD-10-CM | POA: Insufficient documentation

## 2013-04-22 NOTE — Progress Notes (Signed)
Subjective:    Patient ID: Travis Santos, male    DOB: 1941/03/02, 72 y.o.   MRN: [id number]  HPI: Pt is a 72 Y/O gentleman with malignant HTN and urinary retention who is here to establish care. Pt was previously seen by Dr. Renard Matter in [place] for care.   According to patient, he had difficulty with urinating and was seen in the ED at [facility] on 4/18 for a UTI and Urinary retention at which time a foley catheter was placed. He was referred to Dr. Jerre Simon (Urologist) who advised retention of the Cathter until a TURP could be performed. This procedure was delayed because of Urologist's  perceived need for Cardiac clearance by cardiology for a rate controlled atrial fib and HTN. The Cathter has now been indwelling for 4 months, and the patient has now developed swelling of the right scrotum which is a major concern to him. He was seen in the ED on 03/30/2013 when a scrotal U/S showed B/L large septated hydrocele and findings consistent wit a UTI. HE was started on Ciprofloxacin and subsequent culture revealed Serratia which was sensitive to Ciprofloxacin.   Pt also has uncontrolled BP which according to him has been as high as a Systolic of 200. He was referred to Cardiology for BP control which according to him is what has delayed him having the TURP.  Pt also has a H/O A-fib but has a controlled HR. Cardiology does not recommend anticoagulation or cardioversion at this time.    Review of Systems  Constitutional: Negative.   HENT: Negative.   Eyes: Negative.   Respiratory: Negative.   Cardiovascular: Negative.   Gastrointestinal: Negative.   Endocrine: Negative.   Genitourinary: Positive for scrotal swelling (Right Side). Negative for hematuria and testicular pain.  Skin: Negative.   Allergic/Immunologic: Negative.   Neurological: Negative.   Hematological: Negative.   Psychiatric/Behavioral: Negative.        Objective:   Physical Exam  Constitutional: He is oriented to person,  place, and time. He appears well-developed and well-nourished.  Neck: No JVD present. No tracheal deviation present. No thyromegaly present.  Cardiovascular: Normal rate, regular rhythm and normal heart sounds.  Exam reveals no gallop and no friction rub.   No murmur heard. Pulmonary/Chest: Effort normal and breath sounds normal. No stridor. No respiratory distress. He has no wheezes. He has no rales.  Abdominal: Soft. Bowel sounds are normal.  Genitourinary:  Right testicular swelling. No tenderness but does have a hard consistency on palpation.  Musculoskeletal: Normal range of motion. He exhibits no edema and no tenderness.  Neurological: He is alert and oriented to person, place, and time. He has normal reflexes.  Skin: Skin is warm and dry.  Psychiatric: He has a normal mood and affect. His behavior is normal. Judgment and thought content normal.          Assessment & Plan:  1. Malignant HTN: Pt is on a small dose of Lisinopril and HCTZ.. Will add a small dose of Norvasc and re-evaluate BP on next visit. Also will follow up on Doppler US of renal vessels to R/O renal artery stenosis.  2. Epididymitis: Pt to complete Ciprofloxacin which has been given for a 14 day course,. May need a prolonged course if still with scrotal swelling. Pt will also be seen by [facility] on 8/19. Will call to see if the appointment can be changed to [facility] office and expidited.  3. Urinary Retention with BPH: Pt has had an indwelling  catheter for 3 months. Pt will need cystoscopy for further evaluation before proceeding with TURP. Marland Kitchen He has no medical cause to delay a cystoscopy or TURP if necessary. Will speak with [facility] to see if he can obtain an earlier appointment in [place].  No labs ordered today.   RTC 1 week.  Greater than 50% of time spent in counseling and discussion.

## 2013-04-24 ENCOUNTER — Ambulatory Visit (INDEPENDENT_AMBULATORY_CARE_PROVIDER_SITE_OTHER): Payer: Medicare Other | Admitting: Urology

## 2013-04-24 DIAGNOSIS — N401 Enlarged prostate with lower urinary tract symptoms: Secondary | ICD-10-CM | POA: Diagnosis not present

## 2013-04-24 DIAGNOSIS — N433 Hydrocele, unspecified: Secondary | ICD-10-CM | POA: Diagnosis not present

## 2013-04-24 DIAGNOSIS — N453 Epididymo-orchitis: Secondary | ICD-10-CM

## 2013-04-24 DIAGNOSIS — R339 Retention of urine, unspecified: Secondary | ICD-10-CM | POA: Diagnosis not present

## 2013-04-28 ENCOUNTER — Telehealth: Payer: Self-pay

## 2013-04-28 ENCOUNTER — Encounter: Payer: Self-pay | Admitting: Internal Medicine

## 2013-04-28 ENCOUNTER — Ambulatory Visit (INDEPENDENT_AMBULATORY_CARE_PROVIDER_SITE_OTHER): Payer: Medicare Other | Admitting: Internal Medicine

## 2013-04-28 VITALS — BP 154/95 | HR 111 | Temp 97.6°F | Resp 18 | Ht 70.0 in | Wt 200.0 lb

## 2013-04-28 DIAGNOSIS — I10 Essential (primary) hypertension: Secondary | ICD-10-CM

## 2013-04-28 DIAGNOSIS — N139 Obstructive and reflux uropathy, unspecified: Secondary | ICD-10-CM | POA: Diagnosis not present

## 2013-04-28 DIAGNOSIS — N401 Enlarged prostate with lower urinary tract symptoms: Secondary | ICD-10-CM

## 2013-04-28 DIAGNOSIS — N138 Other obstructive and reflux uropathy: Secondary | ICD-10-CM

## 2013-04-28 MED ORDER — TAMSULOSIN HCL 0.4 MG PO CAPS: 0.4000 mg | ORAL_CAPSULE | Freq: Every day | ORAL | Status: DC

## 2013-04-28 NOTE — Progress Notes (Signed)
  Subjective:    Patient ID: Travis Santos, male    DOB: 12/07/1940, 72 y.o.   MRN: [id number]  Hypertension  : Pt presents to office today for follow-up on BP. Pt has not yet had his doppler of renal vessels and did not follow up on obtaining labs. He denies any dizziness, SOB, Headaches, palpitations or Chest pain. Pt states that he does not have any samples of Rapaflo left since Friday.     Review of Systems  Constitutional: Negative.   HENT: Negative.   Eyes: Negative.   Respiratory: Negative.   Cardiovascular: Negative.   Gastrointestinal: Negative.   Endocrine: Negative.   Genitourinary: Negative.   Musculoskeletal: Negative.   Skin: Negative.   Allergic/Immunologic: Negative.   Neurological: Negative.   Hematological: Negative.   Psychiatric/Behavioral: Negative.        Objective:   Physical Exam  Constitutional: He is oriented to person, place, and time. He appears well-developed and well-nourished. No distress.  HENT:  Head: Normocephalic and atraumatic.  Eyes: Conjunctivae and EOM are normal. Pupils are equal, round, and reactive to light. No scleral icterus.  Neck: Normal range of motion. Neck supple.  Cardiovascular: Normal rate and regular rhythm.  Exam reveals no gallop and no friction rub.   No murmur heard. Pulmonary/Chest: Effort normal and breath sounds normal. He has no wheezes. He has no rales. He exhibits no tenderness.  Abdominal: Soft. Bowel sounds are normal. He exhibits no distension and no mass. There is no tenderness.  Musculoskeletal: Normal range of motion.  Neurological: He is alert and oriented to person, place, and time.  Skin: Skin is warm and dry.  Psychiatric: He has a normal mood and affect. His behavior is normal. Judgment and thought content normal.      Assessment & Plan:  1. HTN: BP still elevated. Pt to have renal Artery ultrasound performed but did not report to radiology department. Pt also did not have labs performed (lipid)  and will follow up tomorrow to have them obtained at [facility] lab in [place].   BP still elevated today however no adjustments made to medications today as  patient is changing to Flomax for BPH and this can affect BP and increase risk of orthostasis. Pt advised to check BP daily at about 1-2 pm and return to clinic in 1 week for BP check.   2. BPH: Pt is being followed by Dr. Annabell Howells in Urology. I spoke with him last week and he advised that Flomax could be substituted for Rapaflo. Pt has no more samples of Rapaflo so will prescribe Flomax 0.4 mg daily.  Will reassess BP on Flomax.  3. Atrial fibrillation: HR controlled. No anti-coagulation per Cardiology recommendations  RTC 2 weeks.  Labs: Lipid panel

## 2013-04-28 NOTE — Telephone Encounter (Signed)
This CM spoke with Mr. Montez Morita with [employer] [phone] who stated prior authorization is not required for outpt  [place] art/venous flow abdominal pelvic doppler possible cpt [id number].   This CM also spoke with [employer] [id number] and no prior auth required.   This CM spoke with [facility] [phone] no prior auth required.     Karoline Caldwell, RN, BSN, [place]  [phone]

## 2013-04-29 ENCOUNTER — Other Ambulatory Visit: Payer: Self-pay | Admitting: Internal Medicine

## 2013-04-29 ENCOUNTER — Telehealth: Payer: Self-pay | Admitting: Internal Medicine

## 2013-04-29 DIAGNOSIS — I10 Essential (primary) hypertension: Secondary | ICD-10-CM | POA: Diagnosis not present

## 2013-04-29 LAB — LIPID PANEL
Cholesterol: 219 mg/dL — ABNORMAL HIGH (ref 0–200)
HDL: 54 mg/dL
LDL Cholesterol: 145 mg/dL — ABNORMAL HIGH (ref 0–99)
Total CHOL/HDL Ratio: 4.1 ratio
Triglycerides: 99 mg/dL
VLDL: 20 mg/dL (ref 0–40)

## 2013-05-04 ENCOUNTER — Encounter: Payer: Self-pay | Admitting: Internal Medicine

## 2013-05-04 ENCOUNTER — Ambulatory Visit: Payer: Medicare Other | Admitting: *Deleted

## 2013-05-05 NOTE — Progress Notes (Unsigned)
Per Dr. Ashley Royalty keep next  appt 05/18/13 will follow up

## 2013-05-14 ENCOUNTER — Encounter (INDEPENDENT_AMBULATORY_CARE_PROVIDER_SITE_OTHER): Payer: Medicare Other

## 2013-05-14 DIAGNOSIS — I10 Essential (primary) hypertension: Secondary | ICD-10-CM

## 2013-05-18 ENCOUNTER — Encounter: Payer: Self-pay | Admitting: Internal Medicine

## 2013-05-18 ENCOUNTER — Ambulatory Visit (INDEPENDENT_AMBULATORY_CARE_PROVIDER_SITE_OTHER): Payer: Medicare Other | Admitting: Internal Medicine

## 2013-05-18 VITALS — BP 145/82 | HR 64 | Temp 98.0°F | Resp 16 | Ht 72.0 in | Wt 200.0 lb

## 2013-05-18 DIAGNOSIS — I10 Essential (primary) hypertension: Secondary | ICD-10-CM | POA: Diagnosis not present

## 2013-05-18 DIAGNOSIS — N139 Obstructive and reflux uropathy, unspecified: Secondary | ICD-10-CM

## 2013-05-18 DIAGNOSIS — N401 Enlarged prostate with lower urinary tract symptoms: Secondary | ICD-10-CM | POA: Diagnosis not present

## 2013-05-18 DIAGNOSIS — T63461A Toxic effect of venom of wasps, accidental (unintentional), initial encounter: Secondary | ICD-10-CM

## 2013-05-18 DIAGNOSIS — N138 Other obstructive and reflux uropathy: Secondary | ICD-10-CM | POA: Insufficient documentation

## 2013-05-18 DIAGNOSIS — T6391XA Toxic effect of contact with unspecified venomous animal, accidental (unintentional), initial encounter: Secondary | ICD-10-CM | POA: Diagnosis not present

## 2013-05-18 DIAGNOSIS — T63441A Toxic effect of venom of bees, accidental (unintentional), initial encounter: Secondary | ICD-10-CM | POA: Insufficient documentation

## 2013-05-18 MED ORDER — EPINEPHRINE 0.3 MG/0.3ML IJ SOAJ: 0.3000 mg | Freq: Once | INTRAMUSCULAR | Status: DC

## 2013-05-18 MED ORDER — TAMSULOSIN HCL 0.4 MG PO CAPS: 0.4000 mg | ORAL_CAPSULE | Freq: Every day | ORAL | Status: DC

## 2013-05-18 MED ORDER — LISINOPRIL 20 MG PO TABS: 20.0000 mg | ORAL_TABLET | Freq: Every day | ORAL | Status: DC

## 2013-05-18 MED ORDER — HYDROCHLOROTHIAZIDE 12.5 MG PO CAPS
12.5000 mg | ORAL_CAPSULE | Freq: Every day | ORAL | Status: DC
Start: 2013-05-18 — End: 2013-08-13

## 2013-05-18 MED ORDER — AMLODIPINE BESYLATE 5 MG PO TABS: 5.0000 mg | ORAL_TABLET | Freq: Every day | ORAL | Status: DC

## 2013-05-18 NOTE — Progress Notes (Signed)
  Subjective:    Patient ID: Travis Santos, male    DOB: 1941-04-15, 72 y.o.   MRN: [id number]  Hypertension  :Pt here for follow-up on HTN and Bee sting. Pt denies any dizziness, change in urination, chest pain or abdominal pain.     Review of Systems  Constitutional: Negative.   HENT: Negative.   Eyes: Negative.   Respiratory: Negative.   Cardiovascular: Negative.   Gastrointestinal: Negative.   Endocrine: Negative.   Genitourinary: Negative.   Musculoskeletal: Negative.   Skin: Negative.   Allergic/Immunologic: Negative.   Neurological: Negative.   Hematological: Negative.   Psychiatric/Behavioral: Negative.        Objective:   Physical Exam  Constitutional: He is oriented to person, place, and time. He appears well-developed and well-nourished.  HENT:  Head: Atraumatic.  Eyes: Conjunctivae and EOM are normal. Pupils are equal, round, and reactive to light.  Neck: Normal range of motion. Neck supple.  Cardiovascular: Normal rate and regular rhythm.  Exam reveals no gallop and no friction rub.   No murmur heard. Pulmonary/Chest: Effort normal and breath sounds normal. He has no wheezes. He has no rales. He exhibits no tenderness.  Abdominal: Soft. Bowel sounds are normal. He exhibits no distension and no mass.  Musculoskeletal: Normal range of motion.  Neurological: He is alert and oriented to person, place, and time.  Skin: Skin is warm and dry.  Psychiatric: He has a normal mood and affect. His behavior is normal. Judgment and thought content normal.          Assessment & Plan:  1. HTN: Pt's BP now much better controlled. Pt has no symptoms of orthostatic changes. Will continue on current regimen and re-evaluate in 3 months. Pt advised to contact MD if any symptoms of orthostasis.  2. Bee Sting with reaction: Pt had a bee sting last week and was prescribed medrol dose-pack. He had a resolution of symptoms and will be prescribed   RTC; 3 months  Labs: 1  week prior to visit- BMET. Mg.

## 2013-05-22 ENCOUNTER — Ambulatory Visit (INDEPENDENT_AMBULATORY_CARE_PROVIDER_SITE_OTHER): Payer: Medicare Other | Admitting: Urology

## 2013-05-22 DIAGNOSIS — N433 Hydrocele, unspecified: Secondary | ICD-10-CM | POA: Diagnosis not present

## 2013-05-22 DIAGNOSIS — N453 Epididymo-orchitis: Secondary | ICD-10-CM | POA: Diagnosis not present

## 2013-05-22 DIAGNOSIS — R339 Retention of urine, unspecified: Secondary | ICD-10-CM

## 2013-05-22 DIAGNOSIS — N401 Enlarged prostate with lower urinary tract symptoms: Secondary | ICD-10-CM | POA: Diagnosis not present

## 2013-06-17 DIAGNOSIS — R339 Retention of urine, unspecified: Secondary | ICD-10-CM | POA: Diagnosis not present

## 2013-06-25 DIAGNOSIS — D352 Benign neoplasm of pituitary gland: Secondary | ICD-10-CM | POA: Diagnosis not present

## 2013-06-26 ENCOUNTER — Ambulatory Visit (INDEPENDENT_AMBULATORY_CARE_PROVIDER_SITE_OTHER): Payer: Medicare Other | Admitting: Urology

## 2013-06-26 ENCOUNTER — Ambulatory Visit: Payer: Medicare Other | Admitting: Urology

## 2013-06-26 DIAGNOSIS — N312 Flaccid neuropathic bladder, not elsewhere classified: Secondary | ICD-10-CM | POA: Diagnosis not present

## 2013-06-26 DIAGNOSIS — N433 Hydrocele, unspecified: Secondary | ICD-10-CM

## 2013-06-26 DIAGNOSIS — N401 Enlarged prostate with lower urinary tract symptoms: Secondary | ICD-10-CM

## 2013-07-15 ENCOUNTER — Ambulatory Visit (INDEPENDENT_AMBULATORY_CARE_PROVIDER_SITE_OTHER): Payer: Medicare Other | Admitting: Internal Medicine

## 2013-07-15 ENCOUNTER — Encounter: Payer: Self-pay | Admitting: Internal Medicine

## 2013-07-15 ENCOUNTER — Encounter: Payer: Medicare Other | Admitting: Cardiology

## 2013-07-15 VITALS — BP 136/84 | HR 96 | Temp 97.7°F | Resp 18 | Ht 69.5 in | Wt 208.0 lb

## 2013-07-15 DIAGNOSIS — I10 Essential (primary) hypertension: Secondary | ICD-10-CM

## 2013-07-15 DIAGNOSIS — M542 Cervicalgia: Secondary | ICD-10-CM | POA: Diagnosis not present

## 2013-07-15 DIAGNOSIS — R0982 Postnasal drip: Secondary | ICD-10-CM

## 2013-07-15 MED ORDER — MOMETASONE FUROATE 50 MCG/ACT NA SUSP: 4.0000 | Freq: Every day | NASAL | Status: DC

## 2013-07-15 MED ORDER — ACETAMINOPHEN 500 MG PO TABS: 500.0000 mg | ORAL_TABLET | Freq: Four times a day (QID) | ORAL | Status: DC | PRN

## 2013-07-15 NOTE — Progress Notes (Signed)
  Subjective:    Patient ID: Travis Santos, male    DOB: 10-29-1940, 72 y.o.   MRN: [id number]  HPI: Pt here today for perioperative evaluation. I spoke with both Dr. Annabell Howells and Dr. Diona Browner this morning and his surgery is not yet scheduled. In light of pending surgeries and [place] score of 1 Dr, Annabell Howells agrees with holding off anticoagulation until all surgeries completed.  Pt also c/o pain in neck especially when sleeping at night. Pt sleeps on regular pillow and states that he also has stiffness when he wakes in the morning. The stiffness improves as the day progresses. He has no numbness, tingling or weakness of extremities.    Review of Systems  Constitutional: Negative.   HENT: Negative.   Eyes: Negative.   Respiratory: Negative.   Cardiovascular: Negative.   Gastrointestinal: Negative.   Endocrine: Negative.   Genitourinary: Negative.   Musculoskeletal: Positive for neck pain and neck stiffness.  Skin: Negative.   Allergic/Immunologic: Negative.   Neurological: Negative.   Hematological: Negative.   Psychiatric/Behavioral: Negative.       Objective:   Physical Exam  Constitutional: He is oriented to person, place, and time. He appears well-developed and well-nourished.  HENT:  Head: Atraumatic.  Eyes: Conjunctivae and EOM are normal. Pupils are equal, round, and reactive to light. No scleral icterus.  Neck: Normal range of motion. Neck supple.  Cardiovascular: Normal rate and regular rhythm.  Exam reveals no gallop and no friction rub.   No murmur heard. Pulmonary/Chest: Effort normal and breath sounds normal. He has no wheezes. He has no rales. He exhibits no tenderness.  Abdominal: Soft. Bowel sounds are normal. He exhibits no mass.  Musculoskeletal: Normal range of motion.  crepetice noted at cervical spine with lateral flexion and rotation. Pt has forward head and decreased curvature in cervical spine.   Neurological: He is alert and oriented to person, place, and  time. He has normal reflexes.  Normal strength.iin BUE's and BLE's  Skin: Skin is warm and dry.  Psychiatric: He has a normal mood and affect. His behavior is normal. Judgment and thought content normal.          Assessment & Plan:  1. HTN: Initial BP elevated. Recheck of BP 136/84. BP well controlled. Continue current medications.   2. Post-nasal drainage: Treat with intranasal steroids and saline sinus washes.  3. BPH with Obstructive symptoms: Pt to have TURP that is not yet scheduled. His peri-operative risk is low. Pt has A-fib with a [place] score of 1. I discussed his care with Dr. Diona Browner who agrees that no perioperative intervention is necessary as pt had an ECHO which showed normal architecture.   4. Atrial Fibrillation: Pt has a controlled HR. His [place] score is 1 and although he is likely a candidate for anticoagulation. However in light of his pending surgeries and Gamma radiation will hold on anticoagulation until after surgeries. At that time will refer back to Dr. Diona Browner.  5. Enlarging Pituitary Adenoma:  6. Arthritis of cervical spine: CXR, of cervical spine. Tylenol for symptomatic management. Cervical exercises given.  RTC: 08/17/2013  Labs: 1 week before appointment.

## 2013-07-17 ENCOUNTER — Other Ambulatory Visit: Payer: Self-pay | Admitting: Urology

## 2013-08-03 DIAGNOSIS — D352 Benign neoplasm of pituitary gland: Secondary | ICD-10-CM | POA: Diagnosis not present

## 2013-08-03 DIAGNOSIS — Z510 Encounter for antineoplastic radiation therapy: Secondary | ICD-10-CM | POA: Diagnosis not present

## 2013-08-03 DIAGNOSIS — D497 Neoplasm of unspecified behavior of endocrine glands and other parts of nervous system: Secondary | ICD-10-CM | POA: Diagnosis not present

## 2013-08-04 DIAGNOSIS — Z510 Encounter for antineoplastic radiation therapy: Secondary | ICD-10-CM | POA: Diagnosis not present

## 2013-08-04 DIAGNOSIS — D352 Benign neoplasm of pituitary gland: Secondary | ICD-10-CM | POA: Diagnosis not present

## 2013-08-05 DIAGNOSIS — Z510 Encounter for antineoplastic radiation therapy: Secondary | ICD-10-CM | POA: Diagnosis not present

## 2013-08-05 DIAGNOSIS — D352 Benign neoplasm of pituitary gland: Secondary | ICD-10-CM | POA: Diagnosis not present

## 2013-08-06 DIAGNOSIS — D352 Benign neoplasm of pituitary gland: Secondary | ICD-10-CM | POA: Diagnosis not present

## 2013-08-06 DIAGNOSIS — Z510 Encounter for antineoplastic radiation therapy: Secondary | ICD-10-CM | POA: Diagnosis not present

## 2013-08-07 ENCOUNTER — Telehealth: Payer: Self-pay | Admitting: *Deleted

## 2013-08-07 DIAGNOSIS — Z510 Encounter for antineoplastic radiation therapy: Secondary | ICD-10-CM | POA: Diagnosis not present

## 2013-08-07 DIAGNOSIS — D352 Benign neoplasm of pituitary gland: Secondary | ICD-10-CM | POA: Diagnosis not present

## 2013-08-07 NOTE — Telephone Encounter (Signed)
Per pt called to cx appointment for labs on 08/10/13 due to radiation treatment. Will he need to come to this appointment or can he wait until the 08/17/13 to have labs drawn

## 2013-08-10 ENCOUNTER — Other Ambulatory Visit: Payer: Medicare Other

## 2013-08-10 NOTE — Telephone Encounter (Signed)
Pt can cancel appointment on 12/15 and keep appointment on 12/22. He does need to get labs before his appointment on 12/22. He can come 12/19 for labs. Please notify patient

## 2013-08-13 ENCOUNTER — Encounter (HOSPITAL_COMMUNITY): Payer: Self-pay | Admitting: Pharmacy Technician

## 2013-08-14 ENCOUNTER — Telehealth: Payer: Self-pay | Admitting: *Deleted

## 2013-08-14 ENCOUNTER — Encounter: Payer: Self-pay | Admitting: Internal Medicine

## 2013-08-14 ENCOUNTER — Other Ambulatory Visit: Payer: Medicare Other | Admitting: *Deleted

## 2013-08-14 DIAGNOSIS — I10 Essential (primary) hypertension: Secondary | ICD-10-CM | POA: Diagnosis not present

## 2013-08-14 LAB — BASIC METABOLIC PANEL
BUN: 18 mg/dL (ref 6–23)
CO2: 28 mEq/L (ref 19–32)
Calcium: 9.3 mg/dL (ref 8.4–10.5)
Glucose, Bld: 112 mg/dL — ABNORMAL HIGH (ref 70–99)
Potassium: 3.5 mEq/L (ref 3.5–5.3)
Sodium: 144 mEq/L (ref 135–145)

## 2013-08-14 LAB — LIPID PANEL
Cholesterol: 184 mg/dL (ref 0–200)
HDL: 49 mg/dL (ref >39)
Total CHOL/HDL Ratio: 3.8 Ratio
Triglycerides: 95 mg/dL (ref <150)

## 2013-08-14 NOTE — Telephone Encounter (Signed)
Pt was in office for lab work on 08/14/13 had questions on if he needed to continue taking Flomax states he has been out since 08/13/13. Called  [facility] to checked on refill. Per pharmacist he has 3 refills.Pt was informed to continue taking Flomax.

## 2013-08-17 ENCOUNTER — Ambulatory Visit (HOSPITAL_COMMUNITY)
Admission: RE | Admit: 2013-08-17 | Discharge: 2013-08-17 | Disposition: A | Discharge status: Home / Self Care | Payer: Medicare Other | Source: Ambulatory Visit | Attending: Urology | Admitting: Urology

## 2013-08-17 ENCOUNTER — Encounter: Payer: Self-pay | Admitting: Internal Medicine

## 2013-08-17 ENCOUNTER — Ambulatory Visit (INDEPENDENT_AMBULATORY_CARE_PROVIDER_SITE_OTHER): Payer: Medicare Other | Admitting: Internal Medicine

## 2013-08-17 ENCOUNTER — Encounter (HOSPITAL_COMMUNITY): Payer: Self-pay

## 2013-08-17 ENCOUNTER — Encounter (HOSPITAL_COMMUNITY)
Admission: RE | Admit: 2013-08-17 | Discharge: 2013-08-17 | Disposition: A | Discharge status: Home / Self Care | Payer: Medicare Other | Source: Ambulatory Visit | Attending: Urology | Admitting: Urology

## 2013-08-17 VITALS — BP 147/107 | HR 65 | Temp 97.5°F | Resp 18 | Ht 69.75 in | Wt 205.0 lb

## 2013-08-17 DIAGNOSIS — N138 Other obstructive and reflux uropathy: Secondary | ICD-10-CM | POA: Insufficient documentation

## 2013-08-17 DIAGNOSIS — Z01812 Encounter for preprocedural laboratory examination: Secondary | ICD-10-CM | POA: Insufficient documentation

## 2013-08-17 DIAGNOSIS — I4891 Unspecified atrial fibrillation: Secondary | ICD-10-CM | POA: Diagnosis not present

## 2013-08-17 DIAGNOSIS — Z01818 Encounter for other preprocedural examination: Secondary | ICD-10-CM | POA: Insufficient documentation

## 2013-08-17 DIAGNOSIS — N139 Obstructive and reflux uropathy, unspecified: Secondary | ICD-10-CM | POA: Diagnosis not present

## 2013-08-17 DIAGNOSIS — N401 Enlarged prostate with lower urinary tract symptoms: Secondary | ICD-10-CM | POA: Diagnosis not present

## 2013-08-17 DIAGNOSIS — E876 Hypokalemia: Secondary | ICD-10-CM | POA: Diagnosis not present

## 2013-08-17 DIAGNOSIS — N433 Hydrocele, unspecified: Secondary | ICD-10-CM | POA: Insufficient documentation

## 2013-08-17 HISTORY — DX: Unspecified osteoarthritis, unspecified site: M19.90

## 2013-08-17 LAB — CBC
HCT: 43.9 % (ref 39.0–52.0)
MCH: 31.2 pg (ref 26.0–34.0)
MCHC: 35.1 g/dL (ref 30.0–36.0)
MCV: 88.9 fL (ref 78.0–100.0)
Platelets: 168 10*3/uL (ref 150–400)
RDW: 13.9 % (ref 11.5–15.5)

## 2013-08-17 LAB — BASIC METABOLIC PANEL
BUN: 16 mg/dL (ref 6–23)
Creatinine, Ser: 1.42 mg/dL — ABNORMAL HIGH (ref 0.50–1.35)
GFR calc Af Amer: 55 mL/min — ABNORMAL LOW (ref >90)
GFR calc non Af Amer: 47 mL/min — ABNORMAL LOW (ref >90)
Potassium: 3.4 mEq/L — ABNORMAL LOW (ref 3.5–5.1)

## 2013-08-17 MED ORDER — POTASSIUM CHLORIDE ER 20 MEQ PO TBCR: 10.0000 meq | EXTENDED_RELEASE_TABLET | Freq: Every day | ORAL | Status: DC

## 2013-08-17 NOTE — Patient Instructions (Addendum)
19 Travis Santos  08/17/2013   Your procedure is scheduled on: 08/25/13  Report to [facility] Stay Center at 5:30 AM.  Call this number if you have problems the morning of surgery [phone]: [phone]   Remember:   Do not eat food or drink liquids After Midnight.     Take these medicines the morning of surgery with A SIP OF WATER: amlodipine, nasonex   Do not wear jewelry, make-up or nail polish.  Do not wear lotions, powders, or perfumes. You may wear deodorant.  Do not shave 48 hours prior to surgery. Men may shave face and neck.  Do not bring valuables to the hospital.  Contacts, dentures or bridgework may not be worn into surgery.  Leave suitcase in the car. After surgery it may be brought to your room.  For patients admitted to the hospital, checkout time is 11:00 AM the day of discharge.    Birdie Sons, RN  pre op nurse call if needed [phone]    FAILURE TO FOLLOW THESE INSTRUCTIONS MAY RESULT IN CANCELLATION OF YOUR SURGERY   Patient Signature: ___________________________________________

## 2013-08-17 NOTE — Progress Notes (Addendum)
EKG 03/31/13 on EPIC, surgery clearance note 07/09/13 Dr. Angelyn Punt on chart

## 2013-08-24 NOTE — H&P (Signed)
tive Problems  1. 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 Epididymitis (604.90)  4. 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 Testicular pain (608.9)  History of Present Illness     Travis Santos returns today in f/u.  He has a history of right epididymitis with a reactive hydrocele and has finished a long course of Cipro but he still has some residual swelling and had a reactive hydrocele.  He has a slow stream with some intermittancy and had UDS since the cystoscopy was equivocal for obstruction.   He is now on tamsulosin. He has had prior retention.   He has no dysuria or hematuria.   The Urodynamics showed a PVR of 300cc.  His initial flowrate had a PF of 9cc/sec.  He has delayed sensation to about 400cc and a capacity of 585cc.  He has urgency with instability at above 500cc.   He was able to void but had a reduced stream and a voiding pressure of about 25cm but the contraction was not sustained.   On Flouro, his bladder base was elevated.   Past Medical History  1. History of Atrial fibrillation (427.31)  2. History of esophageal reflux (V12.79)  3. History of hypertension (V12.59)  4. History of Urinary retention (788.20)  Surgical History  1. History of No Surgical Problems  Current Meds  1. AmLODIPine Besylate TABS;  Therapy: (Recorded:26Sep2014) to Recorded  2. Hydrochlorothiazide 12.5 MG Oral Tablet;  Therapy: (Recorded:15Aug2014) to Recorded  3. Lisinopril TABS;  Therapy: (Recorded:15Aug2014) to Recorded  4. Tamsulosin HCl - 0.4 MG Oral Capsule; TAKE 1 CAPSULE EVERY DAY;  Therapy: 29Aug2014 to (Evaluate:24Aug2015)  Requested for: 02Sep2014; Last  Rx:29Aug2014 Ordered  Allergies  1. No Known Drug Allergies  Family History  1. Family history of Death In The Family Father  2. Family history of Death In The Family Mother  3. Family history of Family Health Status Number Of Children  Social History  1. Denied: History of Alcohol Use  2. Denied: History of Caffeine Use  3. Marital History - Currently Married  4. Never A Smoker  5. Occupation: Retired  Research scientist (life sciences) Vital Signs [Data Includes: Last 1 Day]  Recorded: 31Oct2014 03:52PM  Blood Pressure: 154 / 97 Temperature: 98.5 F Heart Rate: 66  Physical Exam Constitutional: Well nourished and well developed . No acute distress.  Pulmonary: No respiratory distress and normal respiratory rhythm and effort.  Cardiovascular: Heart rate and rhythm are normal . No peripheral edema.  Abdomen: The abdomen is soft and nontender. No hernias are palpable.  Genitourinary: Examination of the penis demonstrates an indwelling catheter, but no discharge, no masses, no lesions and a normal meatus. The penis is uncircumcised. The scrotum is normal in appearance and without lesions. Examination of the right scrotum demonstrates a hydrocele.     The hydrocele is further improved and he can now manipulate the penis for voiding. Examination of the left scrotum demostrates no hydrocele. The right epididymis is found to have a 3 cm spermatocele. The left epididymis is palpably normal and non-tender. The right testis is nonpalpable. The left testis is non-tender and without masses.  Lymphatics: The femoral and inguinal nodes are not enlarged or tender.    Results/Data Urine [Data Includes: Last 1 Day]   31Oct2014  COLOR YELLOW   APPEARANCE CLEAR   SPECIFIC GRAVITY 1.010   pH 6.5   GLUCOSE  NEG mg/dL  BILIRUBIN NEG   KETONE NEG mg/dL  BLOOD NEG   PROTEIN NEG mg/dL  UROBILINOGEN 0.2 mg/dL  NITRITE NEG   LEUKOCYTE ESTERASE NEG    The following images/tracing/specimen were independently visualized:  Urodynamic tracing, flouro and report reviewed.  The following clinical lab reports were reviewed:  UA from 10/22 reviewed.    Assessment  1. 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 Benign  prostatic hyperplasia with urinary obstruction (600.01,599.69)  3. 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 He still has a hydrocele related to his prior epdidiymitis.   Plan    Since he continues to empty poorly on tamsulosin,  I believe he will be best served by a TUIP/TURP and right hydrocelectomy and I have reviewed the risks of bleeding, infection, incontinence, stricturing, persistent voiding difficulty, wound complications, testicular loss or pain, recurrent hydrocele, thrombotic events and anesthetic complications.   Discussion/Summary        CC: Dr. Marthann Schiller, Dr. Nona Dell,  Dr. Austin Miles and Dr. Brooke Pace.

## 2013-08-25 ENCOUNTER — Encounter (HOSPITAL_COMMUNITY): Payer: Self-pay

## 2013-08-25 ENCOUNTER — Encounter (HOSPITAL_COMMUNITY): Payer: Medicare Other | Admitting: Anesthesiology

## 2013-08-25 ENCOUNTER — Encounter (HOSPITAL_COMMUNITY)
Admission: RE | Disposition: A | Discharge status: Home / Self Care | Payer: Self-pay | Source: Ambulatory Visit | Attending: Urology

## 2013-08-25 ENCOUNTER — Ambulatory Visit (HOSPITAL_COMMUNITY)
Admission: RE | Admit: 2013-08-25 | Discharge: 2013-08-26 | Disposition: A | Discharge status: Home / Self Care | Payer: Medicare Other | Source: Ambulatory Visit | Attending: Urology | Admitting: Urology

## 2013-08-25 ENCOUNTER — Ambulatory Visit (HOSPITAL_COMMUNITY): Payer: Medicare Other | Admitting: Anesthesiology

## 2013-08-25 DIAGNOSIS — N400 Enlarged prostate without lower urinary tract symptoms: Secondary | ICD-10-CM | POA: Diagnosis not present

## 2013-08-25 DIAGNOSIS — N312 Flaccid neuropathic bladder, not elsewhere classified: Secondary | ICD-10-CM | POA: Diagnosis not present

## 2013-08-25 DIAGNOSIS — N529 Male erectile dysfunction, unspecified: Secondary | ICD-10-CM | POA: Diagnosis not present

## 2013-08-25 DIAGNOSIS — N329 Bladder disorder, unspecified: Secondary | ICD-10-CM | POA: Diagnosis not present

## 2013-08-25 DIAGNOSIS — N138 Other obstructive and reflux uropathy: Secondary | ICD-10-CM | POA: Diagnosis present

## 2013-08-25 DIAGNOSIS — N453 Epididymo-orchitis: Secondary | ICD-10-CM | POA: Insufficient documentation

## 2013-08-25 DIAGNOSIS — N401 Enlarged prostate with lower urinary tract symptoms: Secondary | ICD-10-CM | POA: Diagnosis not present

## 2013-08-25 DIAGNOSIS — R339 Retention of urine, unspecified: Secondary | ICD-10-CM | POA: Diagnosis not present

## 2013-08-25 DIAGNOSIS — N3020 Other chronic cystitis without hematuria: Secondary | ICD-10-CM | POA: Diagnosis not present

## 2013-08-25 DIAGNOSIS — N4289 Other specified disorders of prostate: Secondary | ICD-10-CM | POA: Diagnosis not present

## 2013-08-25 DIAGNOSIS — Z79899 Other long term (current) drug therapy: Secondary | ICD-10-CM | POA: Diagnosis not present

## 2013-08-25 DIAGNOSIS — N433 Hydrocele, unspecified: Secondary | ICD-10-CM | POA: Diagnosis present

## 2013-08-25 DIAGNOSIS — N320 Bladder-neck obstruction: Secondary | ICD-10-CM | POA: Diagnosis not present

## 2013-08-25 DIAGNOSIS — N318 Other neuromuscular dysfunction of bladder: Secondary | ICD-10-CM | POA: Diagnosis not present

## 2013-08-25 DIAGNOSIS — D494 Neoplasm of unspecified behavior of bladder: Secondary | ICD-10-CM | POA: Diagnosis not present

## 2013-08-25 DIAGNOSIS — K219 Gastro-esophageal reflux disease without esophagitis: Secondary | ICD-10-CM | POA: Diagnosis not present

## 2013-08-25 DIAGNOSIS — I4891 Unspecified atrial fibrillation: Secondary | ICD-10-CM | POA: Diagnosis not present

## 2013-08-25 DIAGNOSIS — I10 Essential (primary) hypertension: Secondary | ICD-10-CM | POA: Insufficient documentation

## 2013-08-25 DIAGNOSIS — N139 Obstructive and reflux uropathy, unspecified: Secondary | ICD-10-CM | POA: Insufficient documentation

## 2013-08-25 HISTORY — PX: TRANSURETHRAL RESECTION OF PROSTATE: SHX73

## 2013-08-25 HISTORY — PX: HYDROCELE EXCISION: SHX482

## 2013-08-25 SURGERY — HYDROCELECTOMY
Anesthesia: General | Laterality: Right

## 2013-08-25 MED ORDER — PHENYLEPHRINE HCL 10 MG/ML IJ SOLN
INTRAMUSCULAR | Status: DC | PRN
  Administered 2013-08-25: 40 ug via INTRAVENOUS
  Administered 2013-08-25: 80 ug via INTRAVENOUS

## 2013-08-25 MED ORDER — AMLODIPINE BESYLATE 5 MG PO TABS
5.0000 mg | ORAL_TABLET | Freq: Every morning | ORAL | Status: DC
  Administered 2013-08-25 – 2013-08-26 (×2): 5 mg via ORAL
  Filled 2013-08-25 (×2): qty 1

## 2013-08-25 MED ORDER — BUPIVACAINE HCL (PF) 0.25 % IJ SOLN
INTRAMUSCULAR | Status: AC
  Filled 2013-08-25: qty 30

## 2013-08-25 MED ORDER — HYOSCYAMINE SULFATE 0.125 MG SL SUBL
0.1250 mg | SUBLINGUAL_TABLET | SUBLINGUAL | Status: DC | PRN
  Filled 2013-08-25: qty 1

## 2013-08-25 MED ORDER — LACTATED RINGERS IV SOLN
INTRAVENOUS | Status: DC
  Administered 2013-08-25: 1000 mL via INTRAVENOUS

## 2013-08-25 MED ORDER — FLUTICASONE PROPIONATE 50 MCG/ACT NA SUSP
1.0000 | Freq: Every day | NASAL | Status: DC
  Administered 2013-08-25 – 2013-08-26 (×2): 1 via NASAL
  Filled 2013-08-25: qty 16

## 2013-08-25 MED ORDER — HYDROCHLOROTHIAZIDE 12.5 MG PO CAPS
12.5000 mg | ORAL_CAPSULE | Freq: Every morning | ORAL | Status: DC
Start: 2013-08-25 — End: 2013-08-26
  Administered 2013-08-25 – 2013-08-26 (×2): 12.5 mg via ORAL
  Filled 2013-08-25 (×2): qty 1

## 2013-08-25 MED ORDER — BELLADONNA ALKALOIDS-OPIUM 16.2-60 MG RE SUPP
RECTAL | Status: AC
  Filled 2013-08-25: qty 1

## 2013-08-25 MED ORDER — HYDRALAZINE HCL 20 MG/ML IJ SOLN
INTRAMUSCULAR | Status: AC
  Administered 2013-08-25: 5 mg
  Filled 2013-08-25: qty 1

## 2013-08-25 MED ORDER — POTASSIUM CHLORIDE ER 10 MEQ PO TBCR
10.0000 meq | EXTENDED_RELEASE_TABLET | Freq: Every day | ORAL | Status: DC
  Administered 2013-08-25 – 2013-08-26 (×2): 10 meq via ORAL
  Filled 2013-08-25 (×2): qty 1

## 2013-08-25 MED ORDER — SODIUM CHLORIDE 0.9 % IR SOLN
Status: DC | PRN
  Administered 2013-08-25: 9000 mL

## 2013-08-25 MED ORDER — FENTANYL CITRATE 0.05 MG/ML IJ SOLN
25.0000 ug | INTRAMUSCULAR | Status: DC | PRN
  Administered 2013-08-25: 12.5 ug via INTRAVENOUS

## 2013-08-25 MED ORDER — LIDOCAINE HCL (CARDIAC) 20 MG/ML IV SOLN
INTRAVENOUS | Status: DC | PRN
  Administered 2013-08-25: 80 mg via INTRAVENOUS

## 2013-08-25 MED ORDER — FENTANYL CITRATE 0.05 MG/ML IJ SOLN
INTRAMUSCULAR | Status: AC
  Filled 2013-08-25: qty 2

## 2013-08-25 MED ORDER — PROPOFOL 10 MG/ML IV BOLUS
INTRAVENOUS | Status: AC
  Filled 2013-08-25: qty 20

## 2013-08-25 MED ORDER — ONDANSETRON HCL 4 MG/2ML IJ SOLN
INTRAMUSCULAR | Status: DC | PRN
  Administered 2013-08-25: 4 mg via INTRAVENOUS

## 2013-08-25 MED ORDER — DOCUSATE SODIUM 100 MG PO CAPS
100.0000 mg | ORAL_CAPSULE | Freq: Two times a day (BID) | ORAL | Status: DC
  Administered 2013-08-25 – 2013-08-26 (×2): 100 mg via ORAL
  Filled 2013-08-25 (×3): qty 1

## 2013-08-25 MED ORDER — DIPHENHYDRAMINE HCL 12.5 MG/5ML PO ELIX: 12.5000 mg | ORAL_SOLUTION | Freq: Four times a day (QID) | ORAL | Status: DC | PRN

## 2013-08-25 MED ORDER — ONDANSETRON HCL 4 MG/2ML IJ SOLN
INTRAMUSCULAR | Status: AC
  Filled 2013-08-25: qty 2

## 2013-08-25 MED ORDER — CIPROFLOXACIN IN D5W 400 MG/200ML IV SOLN
400.0000 mg | INTRAVENOUS | Status: AC
  Administered 2013-08-25: 400 mg via INTRAVENOUS

## 2013-08-25 MED ORDER — LABETALOL HCL 5 MG/ML IV SOLN
INTRAVENOUS | Status: AC
  Filled 2013-08-25: qty 4

## 2013-08-25 MED ORDER — KCL IN DEXTROSE-NACL 20-5-0.45 MEQ/L-%-% IV SOLN
INTRAVENOUS | Status: DC
  Administered 2013-08-25 – 2013-08-26 (×2): via INTRAVENOUS
  Filled 2013-08-25 (×4): qty 1000

## 2013-08-25 MED ORDER — LACTATED RINGERS IV SOLN
INTRAVENOUS | Status: DC | PRN
  Administered 2013-08-25 (×2): via INTRAVENOUS

## 2013-08-25 MED ORDER — HYDRALAZINE HCL 20 MG/ML IJ SOLN
5.0000 mg | Freq: Once | INTRAMUSCULAR | Status: AC
  Administered 2013-08-25: 5 mg via INTRAVENOUS

## 2013-08-25 MED ORDER — TAMSULOSIN HCL 0.4 MG PO CAPS
0.4000 mg | ORAL_CAPSULE | Freq: Every day | ORAL | Status: DC
  Administered 2013-08-26: 10:00:00 0.4 mg via ORAL
  Filled 2013-08-25 (×2): qty 1

## 2013-08-25 MED ORDER — LIDOCAINE HCL 2 % EX GEL
CUTANEOUS | Status: AC
  Filled 2013-08-25: qty 10

## 2013-08-25 MED ORDER — FENTANYL CITRATE 0.05 MG/ML IJ SOLN
INTRAMUSCULAR | Status: AC
  Filled 2013-08-25: qty 5

## 2013-08-25 MED ORDER — LISINOPRIL 20 MG PO TABS
20.0000 mg | ORAL_TABLET | Freq: Every morning | ORAL | Status: DC
Start: 2013-08-25 — End: 2013-08-26
  Administered 2013-08-25 – 2013-08-26 (×2): 20 mg via ORAL
  Filled 2013-08-25 (×2): qty 1

## 2013-08-25 MED ORDER — BUPIVACAINE HCL (PF) 0.25 % IJ SOLN
INTRAMUSCULAR | Status: DC | PRN
  Administered 2013-08-25: 10 mL

## 2013-08-25 MED ORDER — CIPROFLOXACIN IN D5W 400 MG/200ML IV SOLN
INTRAVENOUS | Status: AC
  Filled 2013-08-25: qty 200

## 2013-08-25 MED ORDER — HYDROCODONE-ACETAMINOPHEN 5-325 MG PO TABS
1.0000 | ORAL_TABLET | ORAL | Status: DC | PRN
  Administered 2013-08-26: 1 via ORAL
  Filled 2013-08-25: qty 1

## 2013-08-25 MED ORDER — DOCUSATE SODIUM 100 MG PO CAPS: 100.0000 mg | ORAL_CAPSULE | Freq: Every day | ORAL | Status: DC

## 2013-08-25 MED ORDER — PROPOFOL 10 MG/ML IV BOLUS
INTRAVENOUS | Status: DC | PRN
  Administered 2013-08-25: 200 mg via INTRAVENOUS

## 2013-08-25 MED ORDER — HYDRALAZINE HCL 20 MG/ML IJ SOLN: 5.0000 mg | Freq: Once | INTRAMUSCULAR | Status: DC

## 2013-08-25 MED ORDER — EPINEPHRINE 0.3 MG/0.3ML IJ SOAJ: 0.3000 mg | Freq: Once | INTRAMUSCULAR | Status: DC

## 2013-08-25 MED ORDER — LABETALOL HCL 5 MG/ML IV SOLN
5.0000 mg | INTRAVENOUS | Status: DC | PRN
  Administered 2013-08-25: 5 mg via INTRAVENOUS

## 2013-08-25 MED ORDER — ACETAMINOPHEN 325 MG PO TABS
650.0000 mg | ORAL_TABLET | ORAL | Status: DC | PRN
  Administered 2013-08-25: 650 mg via ORAL
  Filled 2013-08-25: qty 2

## 2013-08-25 MED ORDER — DIPHENHYDRAMINE HCL 50 MG/ML IJ SOLN: 12.5000 mg | Freq: Four times a day (QID) | INTRAMUSCULAR | Status: DC | PRN

## 2013-08-25 MED ORDER — FENTANYL CITRATE 0.05 MG/ML IJ SOLN
INTRAMUSCULAR | Status: DC | PRN
  Administered 2013-08-25: 50 ug via INTRAVENOUS
  Administered 2013-08-25: 25 ug via INTRAVENOUS
  Administered 2013-08-25: 50 ug via INTRAVENOUS

## 2013-08-25 MED ORDER — ZOLPIDEM TARTRATE 5 MG PO TABS: 5.0000 mg | ORAL_TABLET | Freq: Every evening | ORAL | Status: DC | PRN

## 2013-08-25 MED ORDER — PROMETHAZINE HCL 25 MG/ML IJ SOLN: 6.2500 mg | INTRAMUSCULAR | Status: DC | PRN

## 2013-08-25 MED ORDER — HYDROMORPHONE HCL PF 1 MG/ML IJ SOLN: 0.5000 mg | INTRAMUSCULAR | Status: DC | PRN

## 2013-08-25 MED ORDER — CIPROFLOXACIN HCL 500 MG PO TABS
500.0000 mg | ORAL_TABLET | Freq: Two times a day (BID) | ORAL | Status: DC
  Administered 2013-08-25 – 2013-08-26 (×2): 500 mg via ORAL
  Filled 2013-08-25 (×4): qty 1

## 2013-08-25 MED ORDER — ONDANSETRON HCL 4 MG/2ML IJ SOLN: 4.0000 mg | INTRAMUSCULAR | Status: DC | PRN

## 2013-08-25 SURGICAL SUPPLY — 45 items
BAG URINE DRAINAGE (UROLOGICAL SUPPLIES) ×3 IMPLANT
BAG URO CATCHER STRL LF (DRAPE) ×3 IMPLANT
BLADE SURG 15 STRL LF DISP TIS (BLADE) IMPLANT
BLADE SURG 15 STRL SS (BLADE)
BNDG GAUZE ELAST 4 BULKY (GAUZE/BANDAGES/DRESSINGS) ×3 IMPLANT
CATH FOLEY 2WAY SLVR 30CC 22FR (CATHETERS) ×3 IMPLANT
CATH FOLEY 3WAY 30CC 22FR (CATHETERS) IMPLANT
CATH URET 5FR 28IN OPEN ENDED (CATHETERS) IMPLANT
COVER SURGICAL LIGHT HANDLE (MISCELLANEOUS) ×3 IMPLANT
DERMABOND ADVANCED (GAUZE/BANDAGES/DRESSINGS) ×1
DERMABOND ADVANCED .7 DNX12 (GAUZE/BANDAGES/DRESSINGS) ×2 IMPLANT
DRAIN PENROSE 18X1/4 LTX STRL (WOUND CARE) IMPLANT
DRAPE CAMERA CLOSED 9X96 (DRAPES) ×3 IMPLANT
DRAPE LAPAROTOMY T 102X78X121 (DRAPES) ×3 IMPLANT
ELECT BUTTON HF 24-28F 2 30DE (ELECTRODE) IMPLANT
ELECT HF RESECT BIPO 24F 45 ND (CUTTING LOOP) ×3 IMPLANT
ELECT LOOP MED HF 24F 12D (CUTTING LOOP) IMPLANT
ELECT LOOP MED HF 24F 12D CBL (CLIP) ×3 IMPLANT
ELECT REM PT RETURN 9FT ADLT (ELECTROSURGICAL) ×3
ELECTRODE REM PT RTRN 9FT ADLT (ELECTROSURGICAL) ×2 IMPLANT
EVACUATOR MICROVAS BLADDER (UROLOGICAL SUPPLIES) IMPLANT
GAUZE SPONGE 4X4 16PLY XRAY LF (GAUZE/BANDAGES/DRESSINGS) IMPLANT
GLOVE SURG SS PI 8.0 STRL IVOR (GLOVE) ×6 IMPLANT
GOWN STRL REIN XL XLG (GOWN DISPOSABLE) ×9 IMPLANT
HOLDER FOLEY CATH W/STRAP (MISCELLANEOUS) IMPLANT
IV NS IRRIG 3000ML ARTHROMATIC (IV SOLUTION) IMPLANT
KIT ASPIRATION TUBING (SET/KITS/TRAYS/PACK) ×3 IMPLANT
KIT BASIN OR (CUSTOM PROCEDURE TRAY) ×3 IMPLANT
LOOPS RESECTOSCOPE DISP (ELECTROSURGICAL) IMPLANT
MANIFOLD NEPTUNE II (INSTRUMENTS) ×3 IMPLANT
NEEDLE HYPO 22GX1.5 SAFETY (NEEDLE) ×3 IMPLANT
NS IRRIG 1000ML POUR BTL (IV SOLUTION) IMPLANT
PACK CYSTO (CUSTOM PROCEDURE TRAY) ×3 IMPLANT
PACK GENERAL/GYN (CUSTOM PROCEDURE TRAY) ×6 IMPLANT
SPONGE LAP 4X18 X RAY DECT (DISPOSABLE) IMPLANT
SUPPORT SCROTAL LG STRP (MISCELLANEOUS) ×3 IMPLANT
SUPPORT SCROTAL MED ADLT STRP (MISCELLANEOUS) IMPLANT
SUT CHROMIC 3 0 SH 27 (SUTURE) ×9 IMPLANT
SUT CHROMIC 4 0 RB 1X27 (SUTURE) IMPLANT
SUT ETHILON 3 0 PS 1 (SUTURE) IMPLANT
SYR 30ML LL (SYRINGE) IMPLANT
SYR CONTROL 10ML LL (SYRINGE) ×3 IMPLANT
SYRINGE IRR TOOMEY STRL 70CC (SYRINGE) ×3 IMPLANT
TUBING CONNECTING 10 (TUBING) ×3 IMPLANT
WATER STERILE IRR 1500ML POUR (IV SOLUTION) IMPLANT

## 2013-08-25 NOTE — Anesthesia Postprocedure Evaluation (Signed)
  Anesthesia Post-op Note  Patient: Travis Santos  Procedure(s) Performed: Procedure(s) (LRB): HYDROCELECTOMY ADULT (Right) TRANSURETHRAL RESECTION OF THE PROSTATE WITH GYRUS INSTRUMENTS (N/A)  Patient Location: PACU  Anesthesia Type: General  Level of Consciousness: awake and alert   Airway and Oxygen Therapy: Patient Spontanous Breathing  Post-op Pain: mild  Post-op Assessment: Post-op Vital signs reviewed, Patient's Cardiovascular Status Stable, Respiratory Function Stable, Patent Airway and No signs of Nausea or vomiting  Last Vitals:  Filed Vitals:   08/25/13 1215  BP: 151/95  Pulse: 82  Temp: 36.8 C  Resp: 14    Post-op Vital Signs: stable   Complications: No apparent anesthesia complications

## 2013-08-25 NOTE — Transfer of Care (Signed)
Immediate Anesthesia Transfer of Care Note  Patient: Travis Santos  Procedure(s) Performed: Procedure(s): HYDROCELECTOMY ADULT (Right) TRANSURETHRAL RESECTION OF THE PROSTATE WITH GYRUS INSTRUMENTS (N/A)  Patient Location: PACU  Anesthesia Type:General  Level of Consciousness: awake and alert   Airway & Oxygen Therapy: Patient Spontanous Breathing and Patient connected to face mask oxygen  Post-op Assessment: Report given to PACU RN and Post -op Vital signs reviewed and stable  Post vital signs: Reviewed and stable  Complications: No apparent anesthesia complications

## 2013-08-25 NOTE — Brief Op Note (Signed)
08/25/2013  8:58 AM  PATIENT:  Travis Santos  72 y.o. male  PRE-OPERATIVE DIAGNOSIS:  Right Hydrocele, Benign Prostatic Hyperplasia with Bladder Outlet Obstruction  POST-OPERATIVE DIAGNOSIS:  Right Hydrocele, Benign Prostatic Hyperplasia with Bladder Outlet Obstruction, Bladder lesion  PROCEDURE:  Procedure(s): HYDROCELECTOMY ADULT (Right) TRANSURETHRAL RESECTION OF THE PROSTATE WITH GYRUS INSTRUMENTS (N/A) Bladder biopsy x 2.   SURGEON:  Surgeon(s) and Role:    * Bjorn Pippin, MD - Primary  PHYSICIAN ASSISTANT:   ASSISTANTS: none   ANESTHESIA:   general  EBL:  Total I/O In: 1000 [I.V.:1000] Out: -   BLOOD ADMINISTERED:none  DRAINS: Urinary Catheter (Foley)   LOCAL MEDICATIONS USED:  MARCAINE     SPECIMEN:  Source of Specimen:  bladder biopsies and prostate chips.  DISPOSITION OF SPECIMEN:  PATHOLOGY  COUNTS:  YES  TOURNIQUET:  * No tourniquets in log *  DICTATION: .Other Dictation: Dictation Number [id number]  PLAN OF CARE: Admit for overnight observation  PATIENT DISPOSITION:  PACU - hemodynamically stable.   Delay start of Pharmacological VTE agent (>24hrs) due to surgical blood loss or risk of bleeding: yes

## 2013-08-25 NOTE — Interval H&P Note (Signed)
History and Physical Interval Note:  He has had a recent gamma knife treatment of his pituitary and is cleared for surgery by his neuroradiologist.   08/25/2013 7:24 AM  Travis Santos  has presented today for surgery, with the diagnosis of Right Hydrocele, Benign Prostatic Hyperplasia with Bladder Outlet Obstruction  The various methods of treatment have been discussed with the patient and family. After consideration of risks, benefits and other options for treatment, the patient has consented to  Procedure(s): HYDROCELECTOMY ADULT (Right) TRANSURETHRAL INCISION OF THE PROSTATE (TUIP) (N/A) TRANSURETHRAL RESECTION OF THE PROSTATE WITH GYRUS INSTRUMENTS (N/A) as a surgical intervention .  The patient's history has been reviewed, patient examined, no change in status, stable for surgery.  I have reviewed the patient's chart and labs.  Questions were answered to the patient's satisfaction.     Ahmya Bernick J

## 2013-08-25 NOTE — Anesthesia Preprocedure Evaluation (Addendum)
Anesthesia Evaluation  Patient identified by MRN, date of birth, ID band Patient awake    Reviewed: Allergy & Precautions, H&P , NPO status , Patient's Chart, lab work & pertinent test results  Airway Mallampati: II TM Distance: >3 FB Neck ROM: Full    Dental no notable dental hx.    Pulmonary neg pulmonary ROS,  breath sounds clear to auscultation  Pulmonary exam normal       Cardiovascular Exercise Tolerance: Good hypertension, Pt. on medications + dysrhythmias Atrial Fibrillation Rhythm:Irregular Rate:Normal     Neuro/Psych negative neurological ROS  negative psych ROS   GI/Hepatic Neg liver ROS, GERD-  ,  Endo/Other  negative endocrine ROS  Renal/GU negative Renal ROS  negative genitourinary   Musculoskeletal negative musculoskeletal ROS (+)   Abdominal   Peds negative pediatric ROS (+)  Hematology negative hematology ROS (+)   Anesthesia Other Findings   Reproductive/Obstetrics negative OB ROS                          Anesthesia Physical Anesthesia Plan  ASA: III  Anesthesia Plan: General   Post-op Pain Management:    Induction: Intravenous  Airway Management Planned: LMA  Additional Equipment:   Intra-op Plan:   Post-operative Plan: Extubation in OR  Informed Consent: I have reviewed the patients History and Physical, chart, labs and discussed the procedure including the risks, benefits and alternatives for the proposed anesthesia with the patient or authorized representative who has indicated his/her understanding and acceptance.   Dental advisory given  Plan Discussed with: CRNA  Anesthesia Plan Comments:         Anesthesia Quick Evaluation

## 2013-08-26 ENCOUNTER — Ambulatory Visit: Payer: Medicare Other | Admitting: Cardiology

## 2013-08-26 ENCOUNTER — Encounter (HOSPITAL_COMMUNITY): Payer: Self-pay | Admitting: Urology

## 2013-08-26 DIAGNOSIS — N433 Hydrocele, unspecified: Secondary | ICD-10-CM | POA: Diagnosis present

## 2013-08-26 DIAGNOSIS — N329 Bladder disorder, unspecified: Secondary | ICD-10-CM | POA: Diagnosis present

## 2013-08-26 MED ORDER — HYDROCODONE-ACETAMINOPHEN 5-325 MG PO TABS: 1.0000 | ORAL_TABLET | ORAL | Status: DC | PRN

## 2013-08-26 MED ORDER — CIPROFLOXACIN HCL 500 MG PO TABS: 250.0000 mg | ORAL_TABLET | Freq: Two times a day (BID) | ORAL | Status: DC

## 2013-08-26 NOTE — Op Note (Signed)
Travis Santos, Travis Santos             ACCOUNT NO.:  [id number]  MEDICAL RECORD NO.:  [id number]  LOCATION:  1411                         FACILITY:  [facility]  PHYSICIAN:  Excell Seltzer. Annabell Howells, M.D.    DATE OF BIRTH:  30-Sep-1940  DATE OF PROCEDURE:  08/25/2013 DATE OF DISCHARGE:                              OPERATIVE REPORT   PROCEDURES: 1. Right hydrocelectomy. 2. Cystoscopy with bladder biopsy and fulguration x2. 3. Transurethral resection of prostate.  PREOPERATIVE DIAGNOSES: 1. Right hydrocele. 2. Benign prostatic hypertrophy. 3. Bladder outlet obstruction.  POSTOPERATIVE DIAGNOSES: 1. Right hydrocele. 2. Benign prostatic hypertrophy. 3. Bladder outlet obstruction, with posterior bladder wall lesions.  SURGEON:  Excell Seltzer. Annabell Howells, MD  ANESTHESIA:  General.  SPECIMEN:  Bladder biopsies from posterior wall x2, and prostate chips.  ESTIMATED BLOOD LOSS:  Minimal.  DRAINS:  A 22-French Foley catheter.  COMPLICATIONS:  None.  INDICATIONS:  [name] is a 72 year old African American male with BPH and bladder outlet obstruction, and a symptomatic right hydrocele. He has elected to undergo hydrocelectomy and transurethral resection of prostate.  FINDINGS OF PROCEDURE:  He was given Cipro.  He was taken to the operating room where general anesthetic was induced.  He was initially placed in supine position and was fitted with PAS hose.  His genitalia was clipped.  He was prepped with Betadine solution and draped in usual sterile fashion.  An oblique right anterior scrotal incision was made over the hydrocele with a knife.  This was carried through the dartos with the Bovie.  The dartos was very adherent to the hydrocele sac and I was unable to separate it.  Eventually, I opened the sac and drained the hydrocele fluid, there is approximately 50 mL.  There was a dense and inflammatory rind and I could not separate the sac from the testicle or the sac from the dartos, so I imbricated  the hydrocele sac to the sides of the testicle using interrupted 3-0 chromic sutures.  Once the hydrocele sac had been opened and obliterated, the dartos was closed with a running 3- 0 chromic suture.  The cord and edges of the incision were then infiltrated with 10 mL of 0.25% Marcaine.  The skin was closed with interrupted vertical mattress 3-0 chromic sutures and then coated with Dermabond.  Once the hydrocelectomy had been completed, the patient was repositioned into the lithotomy position and re-prepped, and right cystoscopy was performed using a 22-French scope and 12-degree lens.  Examination revealed a normal urethra.  The external sphincter was intact.  The prostatic urethra was approximately 2 to 3 cm in length with bilobar hyperplasia with obstruction.  Examination of bladder revealed moderate trabeculation.  The ureteral orifices were in the normal anatomic position.  The mucosa was unremarkable with the exception of posterior wall where there were some small 1 to 2 mm fleshy outgrowths that did not appear to be typical papillary tumors, but it was felt biopsy was indicated.  A cup biopsy forceps was used to biopsy two of the small lesions.  The cystoscope was then removed.  Urethra was calibrated to 30-French with Sissy Hoff sounds and a 28-French continuous flow resectoscope sheath was inserted.  This  was initially fitted with an [place] handle with a bipolar Collins knife and a 12-degree lens.  The saline as the irrigant.  The biopsy sites on the posterior wall were then fulgurated. I then performed a transurethral incision of prostate, is that was my initial plan, however, once the incisions have been made at 5 and 7 o'clock, I was not happy with the degree of opening of the prostate, so the [facility] knife was switched to a bipolar loop and the floor of the prostate was resected out to and alongside the verumontanum.  The right lobe of the prostate was then resected  from bladder neck to apex.  This was followed by the left lobe.  Residual apical and anterior tissue were then resected as required.  Once an adequate channel had been created, the bladder was evacuated free of chips and small prostatic calculi and hemostasis was achieved. Final inspection revealed no active bleeding.  No retained chips or stones.  The ureteral orifices were intact as was the external sphincter.  The bladder was left full.  The scope was removed.  Pressure produced an adequate stream.  A 22-French Foley catheter was inserted. The balloon was filled with 30 mL of sterile fluid.  The catheter was irrigated with clear return and placed to straight drainage.  The patient was then taken down from lithotomy position.  His anesthetic was reversed.  He was moved to the recovery in stable condition.  There were no complications.     Excell Seltzer. Annabell Howells, M.D.     JJW/MEDQ  D:  08/25/2013  T:  08/26/2013  Job:  [id number]

## 2013-08-26 NOTE — Discharge Summary (Signed)
Physician Discharge Summary  Patient ID: Travis Santos MRN: [id number] DOB/AGE: 73-Nov-1942 73 y.o.  Admit date: 08/25/2013 Discharge date: 08/26/2013  Admission Diagnoses:  Benign localized hyperplasia of prostate with urinary obstruction  Discharge Diagnoses:  Principal Problem:   Benign localized hyperplasia of prostate with urinary obstruction Active Problems:   Hydrocele   Lesion of bladder   Past Medical History  Diagnosis Date  . Essential hypertension, benign   . GERD (gastroesophageal reflux disease)   . Atrial fibrillation   . Prostatic hypertrophy   . Arthritis     Surgeries: Procedure(s): HYDROCELECTOMY ADULT TRANSURETHRAL RESECTION OF THE PROSTATE WITH GYRUS INSTRUMENTS on 08/25/2013 BLADDER BIOPSY X 2 WITH FULGURATION.   Consultants (if any):    Discharged Condition: Improved  Hospital Course: Travis Santos is an 72 y.o. male who was admitted 08/25/2013 with a diagnosis of Benign localized hyperplasia of prostate with urinary obstruction and a right hydrocele and went to the operating room on 08/25/2013 and underwent the above named procedures.  He was found to have small nodules on the posterior bladder wall that were biopsied and fulgurated.   He tolerated the procedure well.  His foley and IV were removed on 12/31 and he was sent home when voiding.   He was given perioperative antibiotics:  Anti-infectives   Start     Dose/Rate Route Frequency Ordered Stop   08/26/13 0000  ciprofloxacin (CIPRO) 500 MG tablet     250 mg Oral 2 times daily 08/26/13 0820     08/25/13 1800  ciprofloxacin (CIPRO) tablet 500 mg     500 mg Oral 2 times daily 08/25/13 1228     08/25/13 0532  ciprofloxacin (CIPRO) IVPB 400 mg     400 mg 200 mL/hr over 60 Minutes Intravenous 60 min pre-op 08/25/13 0532 08/25/13 0742    .  He was given sequential compression devices and early ambulation DVT prophylaxis.  He benefited maximally from the hospital stay and there were  no complications.    Recent vital signs:  Filed Vitals:   08/26/13 0554  BP: 138/94  Pulse: 69  Temp: 98.8 F (37.1 C)  Resp: 18    Recent laboratory studies:  Lab Results  Component Value Date   HGB 15.4 08/17/2013   HGB 14.6 03/30/2013   Lab Results  Component Value Date   WBC 5.6 08/17/2013   PLT 168 08/17/2013   No results found for this basename: INR   Lab Results  Component Value Date   NA 140 08/17/2013   K 3.4* 08/17/2013   CL 103 08/17/2013   CO2 28 08/17/2013   BUN 16 08/17/2013   CREATININE 1.42* 08/17/2013   GLUCOSE 83 08/17/2013    Discharge Medications:     Medication List    STOP taking these medications       tamsulosin 0.4 MG Caps capsule  Commonly known as:  FLOMAX      TAKE these medications       amLODipine 5 MG tablet  Commonly known as:  NORVASC  Take 5 mg by mouth every morning.     ciprofloxacin 500 MG tablet  Commonly known as:  CIPRO  Take 0.5 tablets (250 mg total) by mouth 2 (two) times daily.     Digestive Enzymes Caps  Take 1 capsule by mouth daily.     docusate sodium 100 MG capsule  Commonly known as:  COLACE  Take 100 mg by mouth daily.     EPINEPHrine 0.3  mg/0.3 mL Soaj injection  Commonly known as:  EPI-PEN  Inject 0.3 mLs (0.3 mg total) into the muscle once.     hydrochlorothiazide 12.5 MG capsule  Commonly known as:  MICROZIDE  Take 12.5 mg by mouth every morning.     HYDROcodone-acetaminophen 5-325 MG per tablet  Commonly known as:  NORCO/VICODIN  Take 1-2 tablets by mouth every 4 (four) hours as needed for moderate pain.     lisinopril 20 MG tablet  Commonly known as:  PRINIVIL,ZESTRIL  Take 20 mg by mouth every morning.     mometasone 50 MCG/ACT nasal spray  Commonly known as:  NASONEX  Place 4 sprays into the nose daily.     Potassium Chloride ER 20 MEQ Tbcr  Take 10 mEq by mouth daily.        Diagnostic Studies: Dg Chest 2 View  08/17/2013   CLINICAL DATA:  Atrial fibrillation.  EXAM:  CHEST  2 VIEW  COMPARISON:  04/29/2008.  FINDINGS: Lungs are clear. Borderline cardiomegaly. No evidence of overt congestive heart failure. No pleural effusion or pneumothorax. No acute bony abnormality.  IMPRESSION: Borderline cardiomegaly, otherwise negative chest.   Electronically Signed   By: Maisie Fus  Register   On: 08/17/2013 10:57    Disposition: 01-Home or Self Care       Future Appointments Provider Department Dept Phone   11/16/2013 1:30 PM Altha Harm, MD [facility] [phone]      Follow-up Information   Follow up with Travis Crete, MD On 09/11/2013. [phone])    Specialty:  Urology   Contact information:   [address] [facility]  [place] [address] [phone]        Signed: Anner Santos 08/26/2013, 8:22 AM

## 2013-08-26 NOTE — Progress Notes (Signed)
Pt left with his grandaughter. Alert, oriented, and without c/o. Discharge instructions/prescriptions given/explained with pt verbalizing understanding. Followup appointments noted.  Pt voiding pink urine.

## 2013-09-04 ENCOUNTER — Telehealth: Payer: Self-pay | Admitting: Internal Medicine

## 2013-09-04 MED ORDER — AMLODIPINE BESYLATE 5 MG PO TABS: 5.0000 mg | ORAL_TABLET | Freq: Every morning | ORAL | Status: DC

## 2013-09-04 MED ORDER — LISINOPRIL 20 MG PO TABS: 20.0000 mg | ORAL_TABLET | Freq: Every morning | ORAL | Status: DC

## 2013-09-04 NOTE — Telephone Encounter (Signed)
Prescription for Norvasc #30 with 2 refills and Lisinopril #30 with 2 refills e-scribed to [facility].

## 2013-09-10 ENCOUNTER — Encounter: Payer: Self-pay | Admitting: Internal Medicine

## 2013-09-10 NOTE — Progress Notes (Signed)
Patient ID: Travis Santos, male   DOB: 1941-01-01, 73 y.o.   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 He states that he feels well and has no changes in vision, dizziness or nausea. He is now anticipating a TURP scheduled for 08/25/2013. He states that he has been retaining urine still but there is no bladder distention that he can recognize.   Pt states that he has been taking his medications but his BP is still elevated.   He also states that he has been having some pain in the cervical region of the neck. He describes the pain as mostly aching but sometimes sharp. HE is unable to identify any palliative or provocative features. However, he seems to have more pain when he sleep in particular positions at night. He also notes that it seems worse on cold days.  ROS: All systems negative except as noted in the HPI  Examination: Gen: Pt well appearing. NAD HEENT: [place]/AT, PEERLA, EOMI,  NECK: Supple with normal ROM. However pt does have crepitus in the posterior region. LUNGS: CTA, no wheezing, no rales.  COR: S1S2 normal. No M/R/G Abd: Soft NT/ND no masses not H/S/M, no bladder distension   A/P: 1. Arthritis Cervical Spine: Cervical X-rays from 06/06/2012 reviewed which shows degenerative changes of the spine. Advised pt to use bio-freeze for local topical relief. Discussed PT but patietn does not want to pursue until he has completed his surgery.  2. HTN: BP elevated . However repeat BP somewhat improved. Continue current m,edications until after he has had TURP then re-assess.  3. Hypokalemia: review of labs shows potassium of 3.0 . Will give oral replacement in light if HCTZ use.   LABS: none. Pt has pre-op labs scheduled for today  RTC: Pt to schedule after surgery for annual physical.

## 2013-09-11 ENCOUNTER — Ambulatory Visit (INDEPENDENT_AMBULATORY_CARE_PROVIDER_SITE_OTHER): Payer: Medicare Other | Admitting: Family Medicine

## 2013-09-11 ENCOUNTER — Ambulatory Visit (INDEPENDENT_AMBULATORY_CARE_PROVIDER_SITE_OTHER): Payer: Self-pay | Admitting: Urology

## 2013-09-11 ENCOUNTER — Encounter: Payer: Self-pay | Admitting: Family Medicine

## 2013-09-11 VITALS — BP 130/96 | HR 66 | Temp 97.6°F | Resp 18 | Ht 69.75 in | Wt 203.0 lb

## 2013-09-11 DIAGNOSIS — I4891 Unspecified atrial fibrillation: Secondary | ICD-10-CM | POA: Diagnosis not present

## 2013-09-11 DIAGNOSIS — N401 Enlarged prostate with lower urinary tract symptoms: Secondary | ICD-10-CM

## 2013-09-11 DIAGNOSIS — I10 Essential (primary) hypertension: Secondary | ICD-10-CM

## 2013-09-11 DIAGNOSIS — M542 Cervicalgia: Secondary | ICD-10-CM | POA: Diagnosis not present

## 2013-09-11 DIAGNOSIS — N433 Hydrocele, unspecified: Secondary | ICD-10-CM

## 2013-09-11 DIAGNOSIS — N138 Other obstructive and reflux uropathy: Secondary | ICD-10-CM | POA: Diagnosis not present

## 2013-09-11 MED ORDER — LISINOPRIL 20 MG PO TABS: 20.0000 mg | ORAL_TABLET | Freq: Every morning | ORAL | Status: DC

## 2013-09-11 MED ORDER — AMLODIPINE BESYLATE 5 MG PO TABS: 5.0000 mg | ORAL_TABLET | Freq: Every morning | ORAL | Status: DC

## 2013-09-11 MED ORDER — HYDROCHLOROTHIAZIDE 12.5 MG PO CAPS: 12.5000 mg | ORAL_CAPSULE | Freq: Every morning | ORAL | Status: DC

## 2013-09-11 NOTE — Progress Notes (Signed)
   Subjective:    Patient ID: Travis Santos, male    DOB: Feb 04, 1941, 73 y.o.   MRN: [id number]  HPI Patient is in office for follow up from TURP performed by Dr. Roni Bread on 08/25/2013. Reports that he has not experienced urinary retention and completed Ciprofloxacin 500 mg twice daily on 09/09/2013. Patient denies urinary frequency or urgency and reports occasional hematuria. Patient is scheduled to see Dr. Jeffie Pollock on 09/11/2013 at 1pm.  Patient states that he is "doing well", he had a gamma knife procedure (4 of 4) with Dr. Vallarie Mare recently at [facility] for a benign pituitary macroadenoma. Patient will f/u in 6 months.  Patient did not go to scheduled anticoagulation therapy evaluation  appt with Dr. Domenic Polite, cardiologist d/t recent surgery.    Review of Systems  Constitutional: Negative.   Eyes: Negative for discharge.  Cardiovascular: Negative for chest pain and palpitations.  Endocrine: Negative.   Genitourinary: Negative.  Negative for dysuria, urgency, frequency and hematuria.  Allergic/Immunologic: Negative.   Neurological: Negative for dizziness.       Objective:   Physical Exam  Constitutional: He is oriented to person, place, and time. He appears well-developed.  HENT:  Head: Normocephalic.  Eyes: Pupils are equal, round, and reactive to light.  Neck: Normal range of motion.  Abdominal: Soft. He exhibits no distension. There is no tenderness.  Musculoskeletal: Normal range of motion.       Right ankle: He exhibits no swelling.       Left ankle: He exhibits no swelling.       Cervical back: He exhibits tenderness. He exhibits no edema.  Neurological: He is alert and oriented to person, place, and time.  Skin: Skin is warm.          Assessment & Plan:  1. Hypertension: Return to clinic in 3 months for BMP and lipid panel Continue current blood pressure medication regimen as prescribed Recommend lowfat/ low sodium diet and increase activity to a brisk walk 30  minutes 3 times per week. 2. Cervical pain: Continue with am neck stretches. Recommend OTC Tylenol 500 as needed for mild to moderate neck pain   3. Atrial fibrillation: Schedule appointment with Dr. Domenic Polite for evaluation, possibly starting anti-coagulation therapy. Patient states that original appt was scheduled after surgery

## 2013-09-30 ENCOUNTER — Ambulatory Visit (INDEPENDENT_AMBULATORY_CARE_PROVIDER_SITE_OTHER): Payer: Medicare Other | Admitting: Cardiology

## 2013-09-30 ENCOUNTER — Encounter: Payer: Self-pay | Admitting: Cardiology

## 2013-09-30 VITALS — BP 138/92 | HR 75 | Ht 72.0 in | Wt 201.0 lb

## 2013-09-30 DIAGNOSIS — I4891 Unspecified atrial fibrillation: Secondary | ICD-10-CM | POA: Diagnosis not present

## 2013-09-30 DIAGNOSIS — I10 Essential (primary) hypertension: Secondary | ICD-10-CM

## 2013-09-30 NOTE — Patient Instructions (Signed)
Your physician wants you to follow-up in: 6 MONTHS You will receive a reminder letter in the mail two months in advance. If you don't receive a letter, please call our office to schedule the follow-up appointment. 

## 2013-09-30 NOTE — Assessment & Plan Note (Signed)
Rechecked by me today at 138/92. He is on Norvasc, lisinopril, and HCTZ. I asked him to look into getting a home blood pressure cuff, so that he can keep track and then followup with Dr. Zigmund Daniel for further adjustments if needed.

## 2013-09-30 NOTE — Assessment & Plan Note (Signed)
Rate controlled on no specific medical treatment. CHADS2 score is one. Normal LVEF by echocardiogram. Suggest aspirin and observation for now. He is not symptomatic.

## 2013-09-30 NOTE — Progress Notes (Signed)
Clinical Summary [name] is a 73 y.o.male last seen in July 2014. He has done well without any significant palpitations. Since I saw him he has undergone a gamma knife procedure for pituitary tumor at [facility], also urological surgery.  Echocardiogram from July 2014 demonstrated moderate septal hypertrophy with LVEF 60-65%, no regional wall motion abnormalities, mild left atrial enlargement, mild tricuspid regurgitation, trivial to pericardial effusion. Renal artery duplex from September 2014 showed no renal artery stenosis.  Lab work in December 2014 showed cholesterol 184, triglycerides 95, HDL 49, LDL 116.  He follows with Dr. Zigmund Santos for management of hypertension, has been placed on Norvasc.   Allergies  Allergen Reactions  . Bee Venom     swelling    Current Outpatient Prescriptions  Medication Sig Dispense Refill  . amLODipine (NORVASC) 5 MG tablet Take 1 tablet (5 mg total) by mouth every morning.  90 tablet  0  . Digestive Enzymes CAPS Take 1 capsule by mouth daily.       Marland Kitchen docusate sodium (COLACE) 100 MG capsule Take 100 mg by mouth daily.       Marland Kitchen EPINEPHrine (EPI-PEN) 0.3 mg/0.3 mL SOAJ injection Inject 0.3 mLs (0.3 mg total) into the muscle once.  1 Device  1  . hydrochlorothiazide (MICROZIDE) 12.5 MG capsule Take 1 capsule (12.5 mg total) by mouth every morning.  90 capsule  0  . lisinopril (PRINIVIL,ZESTRIL) 20 MG tablet Take 1 tablet (20 mg total) by mouth every morning.  90 tablet  0  . mometasone (NASONEX) 50 MCG/ACT nasal spray Place 4 sprays into the nose daily.      . potassium chloride 20 MEQ TBCR Take 10 mEq by mouth daily.  30 tablet  3   No current facility-administered medications for this visit.    Past Medical History  Diagnosis Date  . Essential hypertension, benign   . GERD (gastroesophageal reflux disease)   . Atrial fibrillation   . Prostatic hypertrophy   . Arthritis     Social History [name] reports that he has never smoked. He has  never used smokeless tobacco. [name] reports that he does not drink alcohol.  Review of Systems No chest pain or progressive breathlessness. Still functional with ADLs including outdoor chores. Otherwise negative.  Physical Examination Filed Vitals:   09/30/13 1122  BP: 138/92  Pulse: 75   Filed Weights   09/30/13 1045  Weight: 201 lb (91.173 kg)    Normally nourished appearing, comfortable at rest.  HEENT: Conjunctiva and lids normal, oropharynx clear.  Neck: Supple, no elevated JVP or carotid bruits, no thyromegaly.  Lungs: Clear to auscultation, nonlabored breathing at rest.  Cardiac: Irregularly irregular, no S3 or significant systolic murmur, no pericardial rub.  Abdomen: Soft, nontender, bowel sounds present, no guarding or rebound.  Extremities: No pitting edema, distal pulses 2+. Foley catheter leg bag in place.  Skin: Warm and dry.  Musculoskeletal: No kyphosis.  Neuropsychiatric: Alert and oriented x3, affect grossly appropriate.   Problem List and Plan   Atrial fibrillation Rate controlled on no specific medical treatment. CHADS2 score is one. Normal LVEF by echocardiogram. Suggest aspirin and observation for now. He is not symptomatic.  HTN (hypertension) Rechecked by me today at 138/92. He is on Norvasc, lisinopril, and HCTZ. I asked him to look into getting a home blood pressure cuff, so that he can keep track and then followup with Dr. Zigmund Santos for further adjustments if needed.    Satira Sark,  M.D., F.A.C.C.

## 2013-11-16 ENCOUNTER — Ambulatory Visit: Payer: Medicare Other | Admitting: Internal Medicine

## 2013-12-16 ENCOUNTER — Ambulatory Visit (INDEPENDENT_AMBULATORY_CARE_PROVIDER_SITE_OTHER): Payer: Medicare Other | Admitting: Internal Medicine

## 2013-12-16 ENCOUNTER — Other Ambulatory Visit: Payer: Self-pay | Admitting: Internal Medicine

## 2013-12-16 ENCOUNTER — Encounter: Payer: Self-pay | Admitting: Internal Medicine

## 2013-12-16 VITALS — BP 139/92 | HR 65 | Temp 98.0°F | Resp 20 | Ht 72.0 in | Wt 204.0 lb

## 2013-12-16 DIAGNOSIS — R29898 Other symptoms and signs involving the musculoskeletal system: Secondary | ICD-10-CM | POA: Diagnosis not present

## 2013-12-16 DIAGNOSIS — E785 Hyperlipidemia, unspecified: Secondary | ICD-10-CM

## 2013-12-16 DIAGNOSIS — T887XXA Unspecified adverse effect of drug or medicament, initial encounter: Secondary | ICD-10-CM

## 2013-12-16 DIAGNOSIS — I10 Essential (primary) hypertension: Secondary | ICD-10-CM | POA: Diagnosis not present

## 2013-12-16 DIAGNOSIS — M47812 Spondylosis without myelopathy or radiculopathy, cervical region: Secondary | ICD-10-CM

## 2013-12-16 MED ORDER — SIMVASTATIN 20 MG PO TABS: 20.0000 mg | ORAL_TABLET | Freq: Every day | ORAL | Status: DC

## 2013-12-16 NOTE — Progress Notes (Signed)
   Subjective:    Patient ID: Travis Santos, male    DOB: 1941-03-29, 73 y.o.   MRN: [id number]  HPI: Pt here today for 3 month follow up on HTN and also to address hyperlipidemia.  He also has continued neck pain at rest and  c/o perceived weakness in B/L hands. He states that he has difficulty with handling his tools whic he noticed when the weather got warm and he started working outside. He also reports that he has been dropping things occasionally for some time but thought he was just being clumsy. He denies any sensory changes.  Pt also reports that since he has had the TURP he has noticed decreased Libido and cannot maintain an erection.    Review of Systems  HENT: Negative.   Eyes: Negative.   Respiratory: Negative.   Cardiovascular: Negative.   Gastrointestinal: Negative.   Endocrine: Negative.   Genitourinary: Negative.   Musculoskeletal: Positive for neck pain.  Skin: Negative.   Allergic/Immunologic: Negative.   Neurological: Positive for weakness (BUE's). Negative for dizziness, tremors, facial asymmetry, numbness and headaches.  Hematological: Negative.   Psychiatric/Behavioral: Negative.        Objective:   Physical Exam  Vitals reviewed. Constitutional: He is oriented to person, place, and time. He appears well-developed and well-nourished.  HENT:  Head: Atraumatic.  Eyes: Conjunctivae and EOM are normal. Pupils are equal, round, and reactive to light. No scleral icterus.  Neck: Normal range of motion. Neck supple.  Cardiovascular: Normal rate and regular rhythm.  Exam reveals no gallop and no friction rub.   No murmur heard. Pulmonary/Chest: Effort normal and breath sounds normal. He has no wheezes. He has no rales. He exhibits no tenderness.  Abdominal: Soft. Bowel sounds are normal. He exhibits no mass.  Musculoskeletal: Normal range of motion.  Neurological: He is alert and oriented to person, place, and time. He displays normal reflexes. He exhibits  normal muscle tone. Coordination normal.  Strength 4+-5/5 in BUE's.  Skin: Skin is warm and dry.  Psychiatric: He has a normal mood and affect. His behavior is normal. Judgment and thought content normal.          Assessment & Plan:  1. HTN: BP much improved. Will continue current medications and ask patient to take ambulatory BP for comparison.   2. Atrial Fibrillation: CHADS2 score is 1:  No coumadin. Has not been taking ASA will restart on EC 81 mg daily.  3. Erectile Dysfunction: Pt reports decreased libido and erectile dysfunction. He does not want to start any Phosphodiesterase inhibitors.  4.  Hyperlipidemia: LDL mildly elevatated. ASCVD calculated 10 year risk 24.2%. Thus pt should be on a moderate to high idose statin. Pt has been reluctant to take any medications however today he is  agreeable to start on lipid lowering therapy. Will start Zocor 20 mg each night.  5. Neck and shoulder pain with UE weakness: Pt has known cervical spine degeneration. He is now complaining of perceived weakness of BUE with dropping of objects and not being able to handle his tools as well as before.   Labs: BMET, Magnesium  Studies: MR cervical spine  RTC 3 months

## 2013-12-17 LAB — BASIC METABOLIC PANEL
BUN: 17 mg/dL (ref 6–23)
CALCIUM: 9.6 mg/dL (ref 8.4–10.5)
CO2: 24 mEq/L (ref 19–32)
CREATININE: 1.2 mg/dL (ref 0.50–1.35)
Chloride: 108 mEq/L (ref 96–112)
Glucose, Bld: 71 mg/dL (ref 70–99)
Potassium: 3.7 mEq/L (ref 3.5–5.3)
Sodium: 143 mEq/L (ref 135–145)

## 2013-12-17 LAB — MAGNESIUM: MAGNESIUM: 2 mg/dL (ref 1.5–2.5)

## 2013-12-30 ENCOUNTER — Other Ambulatory Visit (HOSPITAL_COMMUNITY): Payer: Self-pay | Admitting: Family Medicine

## 2013-12-30 MED ORDER — HYDROCHLOROTHIAZIDE 12.5 MG PO CAPS: 12.5000 mg | ORAL_CAPSULE | Freq: Every morning | ORAL | Status: DC

## 2014-01-13 ENCOUNTER — Other Ambulatory Visit: Payer: Self-pay | Admitting: Internal Medicine

## 2014-01-15 ENCOUNTER — Ambulatory Visit (INDEPENDENT_AMBULATORY_CARE_PROVIDER_SITE_OTHER): Payer: Medicare Other | Admitting: Urology

## 2014-01-15 DIAGNOSIS — N433 Hydrocele, unspecified: Secondary | ICD-10-CM

## 2014-01-15 DIAGNOSIS — N138 Other obstructive and reflux uropathy: Secondary | ICD-10-CM | POA: Diagnosis not present

## 2014-01-15 DIAGNOSIS — N401 Enlarged prostate with lower urinary tract symptoms: Secondary | ICD-10-CM

## 2014-01-15 DIAGNOSIS — R3915 Urgency of urination: Secondary | ICD-10-CM

## 2014-02-04 DIAGNOSIS — D352 Benign neoplasm of pituitary gland: Secondary | ICD-10-CM | POA: Diagnosis not present

## 2014-02-04 DIAGNOSIS — D353 Benign neoplasm of craniopharyngeal duct: Secondary | ICD-10-CM | POA: Diagnosis not present

## 2014-03-04 DIAGNOSIS — Z9889 Other specified postprocedural states: Secondary | ICD-10-CM | POA: Diagnosis not present

## 2014-03-04 DIAGNOSIS — D352 Benign neoplasm of pituitary gland: Secondary | ICD-10-CM | POA: Diagnosis not present

## 2014-03-04 DIAGNOSIS — D353 Benign neoplasm of craniopharyngeal duct: Secondary | ICD-10-CM | POA: Diagnosis not present

## 2014-03-17 ENCOUNTER — Ambulatory Visit (INDEPENDENT_AMBULATORY_CARE_PROVIDER_SITE_OTHER): Payer: Medicare Other | Admitting: Family Medicine

## 2014-03-17 ENCOUNTER — Encounter: Payer: Self-pay | Admitting: Family Medicine

## 2014-03-17 VITALS — BP 133/94 | HR 64 | Temp 98.3°F | Resp 16 | Ht 72.0 in | Wt 198.0 lb

## 2014-03-17 DIAGNOSIS — I10 Essential (primary) hypertension: Secondary | ICD-10-CM

## 2014-03-17 DIAGNOSIS — E785 Hyperlipidemia, unspecified: Secondary | ICD-10-CM | POA: Diagnosis not present

## 2014-03-17 DIAGNOSIS — N529 Male erectile dysfunction, unspecified: Secondary | ICD-10-CM

## 2014-03-17 DIAGNOSIS — Z23 Encounter for immunization: Secondary | ICD-10-CM | POA: Diagnosis not present

## 2014-03-17 LAB — BASIC METABOLIC PANEL
BUN: 16 mg/dL (ref 6–23)
CALCIUM: 9.4 mg/dL (ref 8.4–10.5)
CO2: 27 mEq/L (ref 19–32)
Chloride: 106 mEq/L (ref 96–112)
Creat: 1.29 mg/dL (ref 0.50–1.35)
Glucose, Bld: 86 mg/dL (ref 70–99)
Potassium: 4 mEq/L (ref 3.5–5.3)
Sodium: 140 mEq/L (ref 135–145)

## 2014-03-17 NOTE — Progress Notes (Signed)
Subjective:    Patient ID: Travis Santos, male    DOB: 29-Aug-1940, 73 y.o.   MRN: [id number]  HPI  Patient is in office for follow up of hypertension and hyperlipidemia. He states that he has been doing well on current medication regimen. He maintains that he follows a balanced diet and has increased water intake. He also remains active. Mr. Kalafut denies headaches, dizziness, chest pains, SOB, edema, or palpitations.   Patient states that he had a follow up with Dr. Roni Bread from TURP performed on 08/25/2013. Reports that he has not experienced urinary retention. Patient denies urinary frequency, retention,  Urgency, or hematuria. He states that he has been released from urology.   Patient states that he is followed by oncologist at [facility]. He he had a gamma knife procedure (4 of 4) with Dr. Vallarie Mare for a benign pituitary macroadenoma. Patient states that tumor has decreased in size per oncologist.  .     Review of Systems  Constitutional: Negative.   HENT: Negative.   Eyes: Negative.   Respiratory: Negative.   Cardiovascular: Negative.   Gastrointestinal: Negative.   Endocrine: Negative.   Genitourinary: Negative.   Musculoskeletal: Negative.   Skin: Negative.   Allergic/Immunologic: Negative.   Neurological: Negative.   Hematological: Negative.   Psychiatric/Behavioral: Negative.        Objective:   Physical Exam  Constitutional: He is oriented to person, place, and time. He appears well-developed and well-nourished.  HENT:  Head: Normocephalic and atraumatic.  Right Ear: External ear normal.  Eyes: Pupils are equal, round, and reactive to light.  Neck: Normal range of motion. Neck supple.  Abdominal: Soft. Bowel sounds are normal.  Musculoskeletal: Normal range of motion.  Neurological: He is alert and oriented to person, place, and time. He has normal reflexes.  Skin: Skin is warm and dry.  Psychiatric: He has a normal mood and affect. His behavior is  normal. Judgment and thought content normal.     BP 133/94  Pulse 64  Temp(Src) 98.3 F (36.8 C) (Oral)  Resp 16  Ht 6' (1.829 m)  Wt 198 lb (89.812 kg)  BMI 26.85 kg/m2     Assessment & Plan:  1. Hypertension-Controlled on current medication regimen. Will check BMP and urinalysis. Patient states that he has an appointment with Dr. Duard Brady, cardiologist on tomorrow.   2. Hyperlipidemia: Will check lipid panel prior to CPE in 3 months. He will continue daily aspirin therapy.   3. Erectile dysfunction: Patient states that he no longer concerned abound decreased libido and occasional erectile dysfunction. He is not interested in starting medications   Labs: BMP, UA Immunizations: Tdap received without complication RTC: 3 months for complete physical examination       Hollis,Lachina M, FNP   Current outpatient prescriptions:amLODipine (NORVASC) 5 MG tablet, TAKE 1 TABLET BY MOUTH EVERY MORNING., Disp: 90 tablet, Rfl: 0;  aspirin EC 81 MG tablet, Take 81 mg by mouth daily., Disp: , Rfl: ;  Digestive Enzymes CAPS, Take 1 capsule by mouth daily. , Disp: , Rfl: ;  docusate sodium (COLACE) 100 MG capsule, Take 100 mg by mouth daily. , Disp: , Rfl:  EPINEPHrine (EPI-PEN) 0.3 mg/0.3 mL SOAJ injection, Inject 0.3 mLs (0.3 mg total) into the muscle once., Disp: 1 Device, Rfl: 1;  hydrochlorothiazide (MICROZIDE) 12.5 MG capsule, Take 1 capsule (12.5 mg total) by mouth every morning., Disp: 90 capsule, Rfl: 0;  lisinopril (PRINIVIL,ZESTRIL) 20 MG tablet, TAKE 1 TABLET BY  MOUTH EVERY MORNING., Disp: 90 tablet, Rfl: 0 potassium chloride (K-DUR) 10 MEQ tablet, TAKE (1) TABLET BY MOUTH ONCE DAILY., Disp: 30 tablet, Rfl: 3;  simvastatin (ZOCOR) 20 MG tablet, Take 1 tablet (20 mg total) by mouth at bedtime., Disp: 90 tablet, Rfl: 3;  mometasone (NASONEX) 50 MCG/ACT nasal spray, Place 4 sprays into the nose daily., Disp: , Rfl:

## 2014-03-18 LAB — URINALYSIS, COMPLETE
Bacteria, UA: NONE SEEN
Bilirubin Urine: NEGATIVE
CRYSTALS: NONE SEEN
Casts: NONE SEEN
Glucose, UA: NEGATIVE mg/dL
HGB URINE DIPSTICK: NEGATIVE
Ketones, ur: NEGATIVE mg/dL
LEUKOCYTES UA: NEGATIVE
NITRITE: NEGATIVE
PH: 6.5 (ref 5.0–8.0)
Protein, ur: NEGATIVE mg/dL
SPECIFIC GRAVITY, URINE: 1.018 (ref 1.005–1.030)
Squamous Epithelial / LPF: NONE SEEN
Urobilinogen, UA: 0.2 mg/dL (ref 0.0–1.0)

## 2014-03-19 DIAGNOSIS — N529 Male erectile dysfunction, unspecified: Secondary | ICD-10-CM | POA: Insufficient documentation

## 2014-04-06 ENCOUNTER — Ambulatory Visit (INDEPENDENT_AMBULATORY_CARE_PROVIDER_SITE_OTHER): Payer: Medicare Other | Admitting: Cardiology

## 2014-04-06 ENCOUNTER — Encounter: Payer: Self-pay | Admitting: Cardiology

## 2014-04-06 VITALS — BP 131/88 | HR 57 | Ht 72.0 in | Wt 200.0 lb

## 2014-04-06 DIAGNOSIS — I10 Essential (primary) hypertension: Secondary | ICD-10-CM

## 2014-04-06 DIAGNOSIS — I4891 Unspecified atrial fibrillation: Secondary | ICD-10-CM | POA: Diagnosis not present

## 2014-04-06 DIAGNOSIS — I482 Chronic atrial fibrillation, unspecified: Secondary | ICD-10-CM

## 2014-04-06 NOTE — Assessment & Plan Note (Signed)
Continues on Norvasc and lisinopril, followed by Dr. Zigmund Daniel.

## 2014-04-06 NOTE — Assessment & Plan Note (Signed)
Continue current medical regimen including aspirin, he does not require any rate control agents at this point. Followup arranged in 6 months.

## 2014-04-06 NOTE — Progress Notes (Signed)
    Clinical Summary Mr. Granholm is a 73 y.o.male last seen in February. He continues to do well, no palpitations, dizziness, exertional chest pain. He continues on the medical regimen outlined below.  Echocardiogram from July 2014 demonstrated moderate septal hypertrophy with LVEF 60-65%, no regional wall motion abnormalities, mild left atrial enlargement, mild tricuspid regurgitation, trivial to pericardial effusion.  CHADSVASC score is 2 at this point (approximately 2.3% annual risk of stroke on ASA). We did discuss potentially considering an anticoagulant as his stroke risk increases. ECG today shows rate-controlled atrial fibrillation with nonspecific T-wave changes.   Allergies  Allergen Reactions  . Bee Venom     swelling    Current Outpatient Prescriptions  Medication Sig Dispense Refill  . amLODipine (NORVASC) 5 MG tablet TAKE 1 TABLET BY MOUTH EVERY MORNING.  90 tablet  0  . aspirin EC 81 MG tablet Take 81 mg by mouth daily.      . Digestive Enzymes CAPS Take 1 capsule by mouth daily.       Marland Kitchen docusate sodium (COLACE) 100 MG capsule Take 100 mg by mouth daily.       Marland Kitchen EPINEPHrine (EPI-PEN) 0.3 mg/0.3 mL SOAJ injection Inject 0.3 mLs (0.3 mg total) into the muscle once.  1 Device  1  . hydrochlorothiazide (MICROZIDE) 12.5 MG capsule Take 1 capsule (12.5 mg total) by mouth every morning.  90 capsule  0  . lisinopril (PRINIVIL,ZESTRIL) 20 MG tablet TAKE 1 TABLET BY MOUTH EVERY MORNING.  90 tablet  0  . mometasone (NASONEX) 50 MCG/ACT nasal spray Place 4 sprays into the nose daily.      . potassium chloride (K-DUR) 10 MEQ tablet TAKE (1) TABLET BY MOUTH ONCE DAILY.  30 tablet  3  . simvastatin (ZOCOR) 20 MG tablet Take 1 tablet (20 mg total) by mouth at bedtime.  90 tablet  3   No current facility-administered medications for this visit.    Past Medical History  Diagnosis Date  . Essential hypertension, benign   . GERD (gastroesophageal reflux disease)   . Atrial fibrillation    . Prostatic hypertrophy   . Arthritis     Social History Mr. Mccord reports that he has never smoked. He has never used smokeless tobacco. Mr. Brosch reports that he does not drink alcohol.  Review of Systems Other systems reviewed and negative except as outlined.  Physical Examination Filed Vitals:   04/06/14 0821  BP: 131/88  Pulse: 57   Filed Weights   04/06/14 0821  Weight: 200 lb (90.719 kg)    Normally nourished appearing, comfortable at rest.  HEENT: Conjunctiva and lids normal, oropharynx clear.  Neck: Supple, no elevated JVP or carotid bruits, no thyromegaly.  Lungs: Clear to auscultation, nonlabored breathing at rest.  Cardiac: Irregularly irregular, no S3 or significant systolic murmur, no pericardial rub.  Extremities: No pitting edema, distal pulses 2+..  Skin: Warm and dry.  Musculoskeletal: No kyphosis.  Neuropsychiatric: Alert and oriented x3, affect grossly appropriate.   Problem List and Plan   Chronic atrial fibrillation Continue current medical regimen including aspirin, he does not require any rate control agents at this point. Followup arranged in 6 months.  Essential hypertension, benign Continues on Norvasc and lisinopril, followed by Dr. Zigmund Daniel.    Satira Sark, M.D., F.A.C.C.

## 2014-04-06 NOTE — Patient Instructions (Signed)

## 2014-05-04 ENCOUNTER — Other Ambulatory Visit: Payer: Self-pay | Admitting: Internal Medicine

## 2014-05-10 IMAGING — CR DG CHEST 2V
2 series · 2 of 2 positions shown · non-contrast
Comparison: 04/29/2008.

CLINICAL DATA: Atrial fibrillation.

EXAM:
CHEST  2 VIEW

[w chest pa]
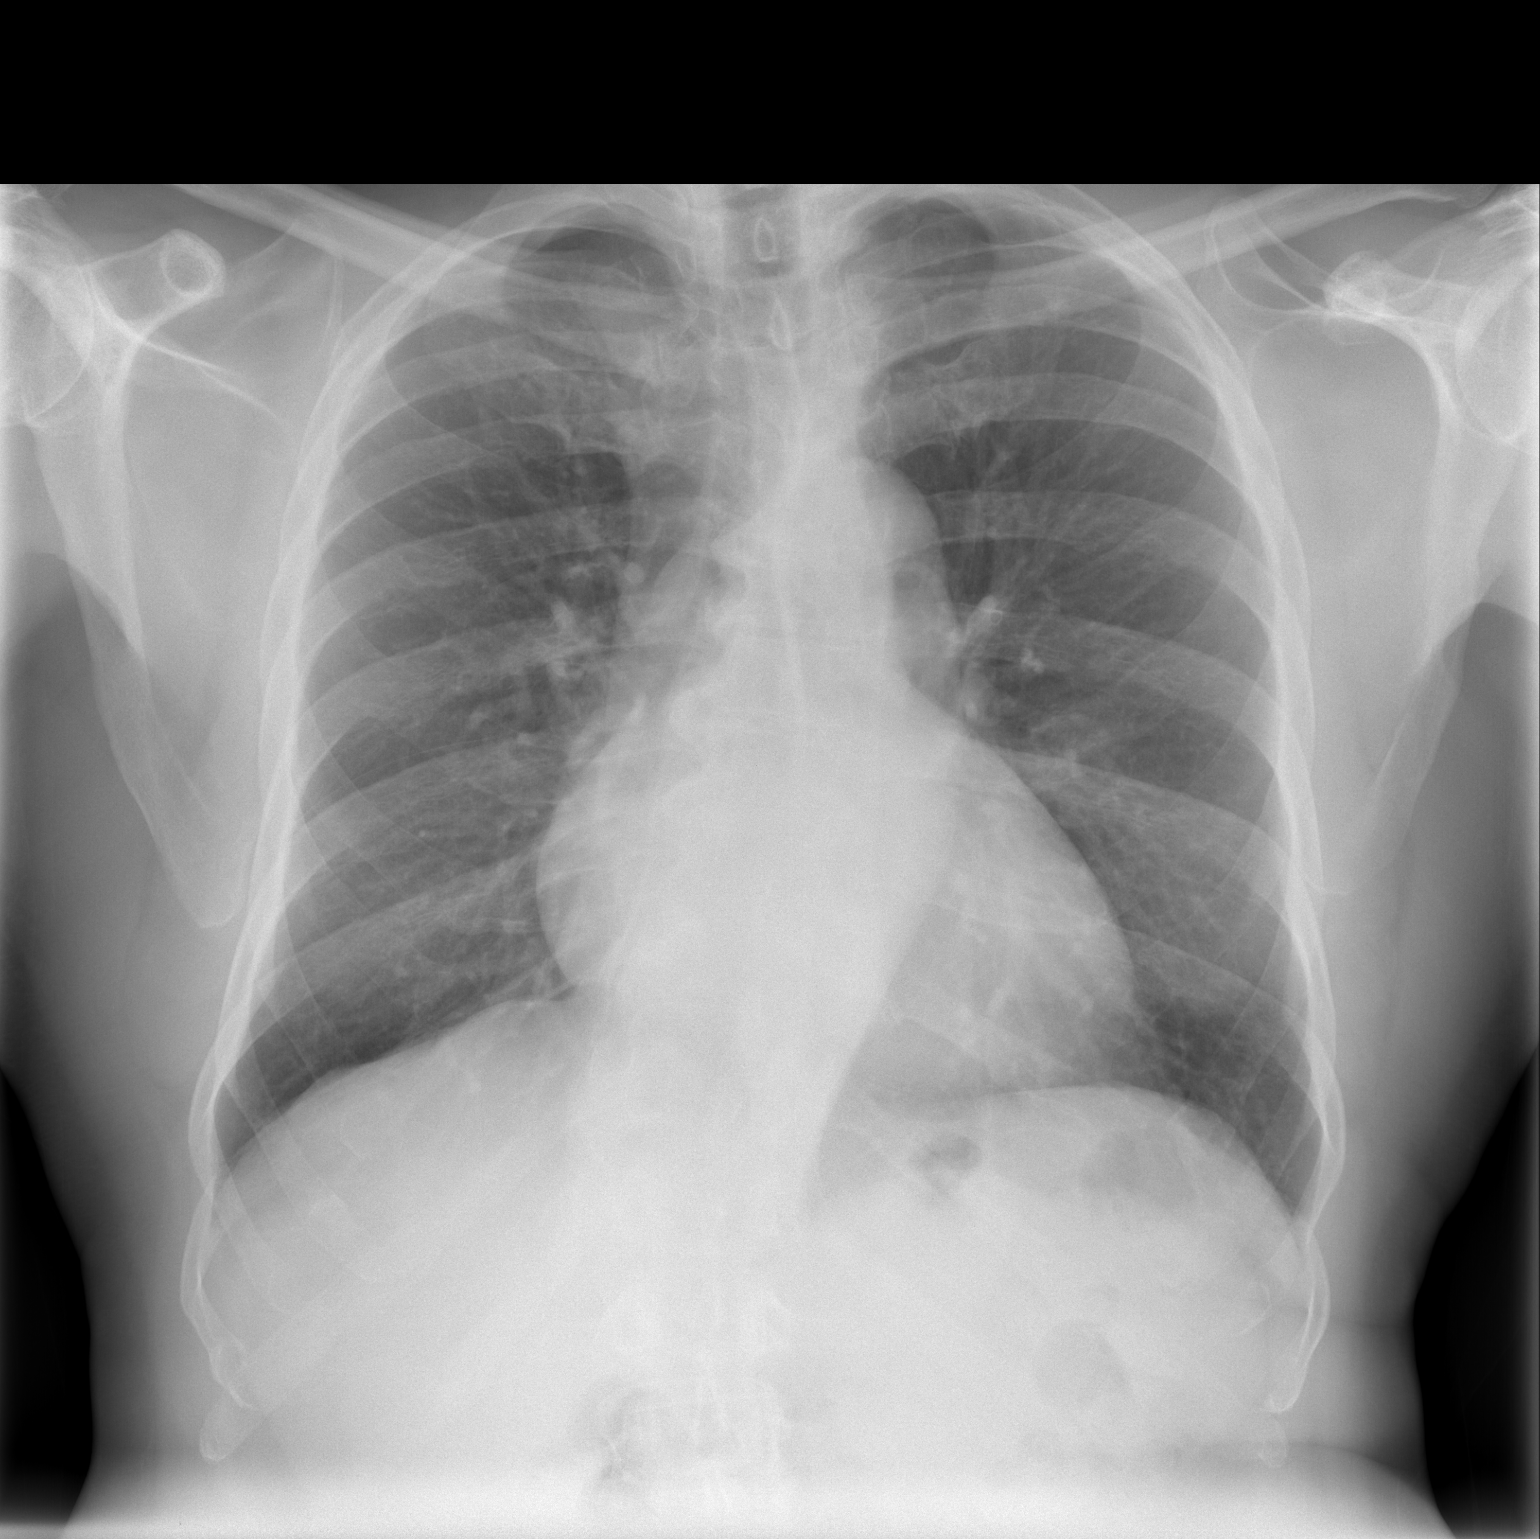

[w chest lat]
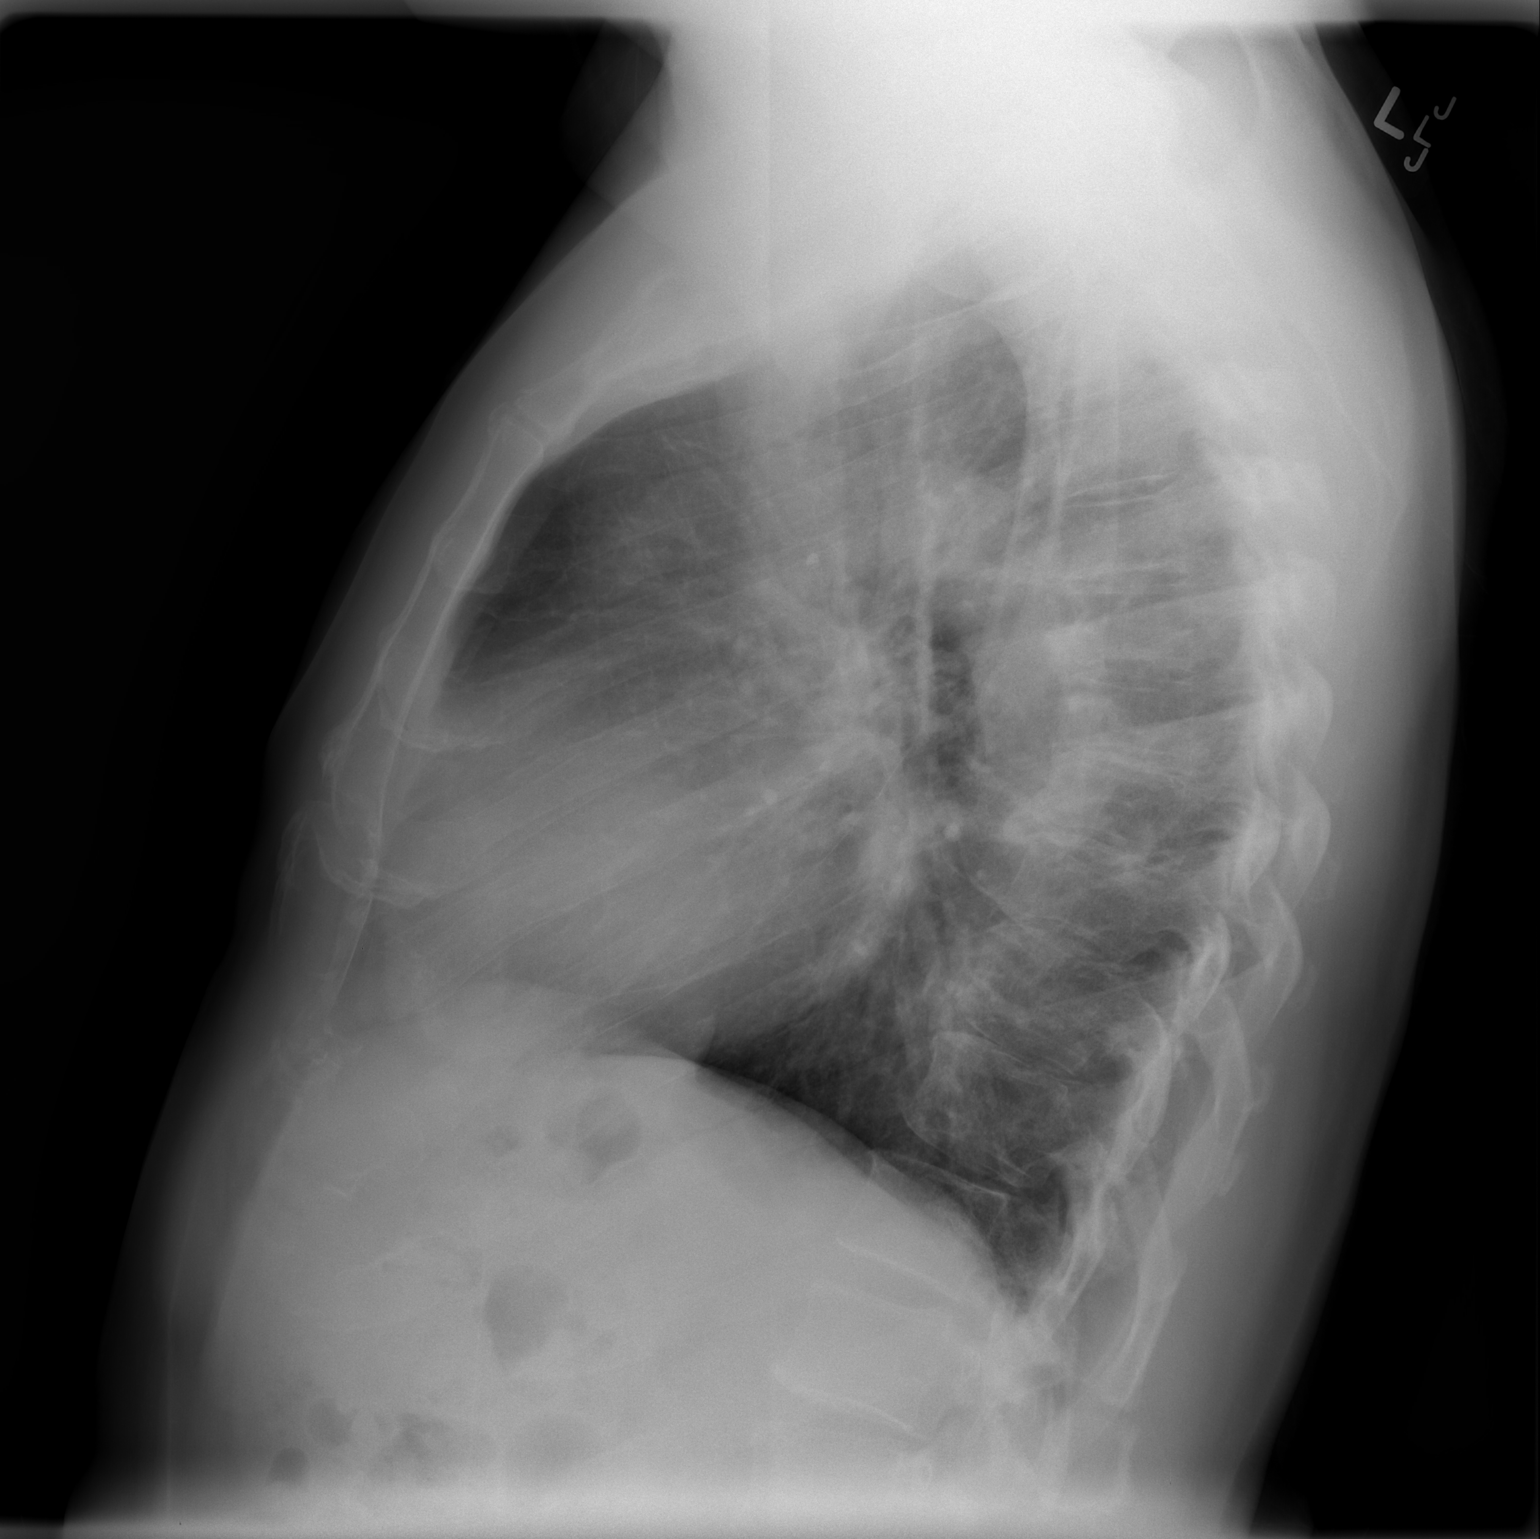

[2 of 2 positions shown; findings below may reference images not displayed]

FINDINGS: Lungs are clear. Borderline cardiomegaly. No evidence of overt
congestive heart failure. No pleural effusion or pneumothorax. No
acute bony abnormality.
IMPRESSION: Borderline cardiomegaly, otherwise negative chest.

## 2014-06-01 ENCOUNTER — Other Ambulatory Visit: Payer: Self-pay | Admitting: Internal Medicine

## 2014-06-07 ENCOUNTER — Other Ambulatory Visit: Payer: Self-pay | Admitting: Internal Medicine

## 2014-06-21 ENCOUNTER — Encounter: Payer: Medicare Other | Admitting: Internal Medicine

## 2014-06-24 ENCOUNTER — Telehealth: Payer: Self-pay | Admitting: Internal Medicine

## 2014-06-24 NOTE — Telephone Encounter (Signed)
Left message for patient to confirm CPE on 06/29/14 at 3pm.

## 2014-06-29 ENCOUNTER — Ambulatory Visit (INDEPENDENT_AMBULATORY_CARE_PROVIDER_SITE_OTHER): Payer: Medicare Other | Admitting: Internal Medicine

## 2014-06-29 VITALS — BP 162/90 | HR 56 | Temp 97.9°F | Resp 16 | Ht 72.0 in | Wt 202.0 lb

## 2014-06-29 DIAGNOSIS — Z0000 Encounter for general adult medical examination without abnormal findings: Secondary | ICD-10-CM | POA: Diagnosis not present

## 2014-06-29 DIAGNOSIS — Z1211 Encounter for screening for malignant neoplasm of colon: Secondary | ICD-10-CM | POA: Insufficient documentation

## 2014-06-29 DIAGNOSIS — I482 Chronic atrial fibrillation, unspecified: Secondary | ICD-10-CM

## 2014-06-29 DIAGNOSIS — E785 Hyperlipidemia, unspecified: Secondary | ICD-10-CM

## 2014-06-29 DIAGNOSIS — R29898 Other symptoms and signs involving the musculoskeletal system: Secondary | ICD-10-CM

## 2014-06-29 DIAGNOSIS — I10 Essential (primary) hypertension: Secondary | ICD-10-CM | POA: Diagnosis not present

## 2014-06-29 DIAGNOSIS — Z23 Encounter for immunization: Secondary | ICD-10-CM

## 2014-06-29 DIAGNOSIS — G542 Cervical root disorders, not elsewhere classified: Secondary | ICD-10-CM | POA: Diagnosis not present

## 2014-06-29 DIAGNOSIS — R449 Unspecified symptoms and signs involving general sensations and perceptions: Secondary | ICD-10-CM

## 2014-06-29 DIAGNOSIS — R4189 Other symptoms and signs involving cognitive functions and awareness: Secondary | ICD-10-CM

## 2014-06-29 DIAGNOSIS — M47812 Spondylosis without myelopathy or radiculopathy, cervical region: Secondary | ICD-10-CM

## 2014-06-29 LAB — LIPID PANEL
CHOLESTEROL: 135 mg/dL (ref 0–200)
HDL: 47 mg/dL (ref >39)
LDL Cholesterol: 69 mg/dL (ref 0–99)
TRIGLYCERIDES: 96 mg/dL (ref <150)
Total CHOL/HDL Ratio: 2.9 Ratio
VLDL: 19 mg/dL (ref 0–40)

## 2014-06-29 MED ORDER — AMLODIPINE BESYLATE 5 MG PO TABS: ORAL_TABLET | ORAL | Status: DC

## 2014-06-29 MED ORDER — HYDROCHLOROTHIAZIDE 25 MG PO TABS: 25.0000 mg | ORAL_TABLET | Freq: Every day | ORAL | Status: DC

## 2014-06-29 MED ORDER — LISINOPRIL 20 MG PO TABS: ORAL_TABLET | ORAL | Status: DC

## 2014-06-29 MED ORDER — POTASSIUM CHLORIDE ER 10 MEQ PO TBCR: EXTENDED_RELEASE_TABLET | ORAL | Status: DC

## 2014-06-29 NOTE — Progress Notes (Signed)
Patient ID: Travis Santos, male   DOB: [birth year], 73 y.o.   MRN: [id number]    Travis Santos, is a 73 y.o. male  NTI:[id number]  [id number]:[id number]  DOB - Jan 18, 1941  CC:  Chief Complaint  Patient presents with  . Annual Exam       HPI: Travis Santos is a 73 y.o. male here today for Annual Physical Examination. He has been doing well. He has been seen in the last 6 months by Cardiology who again re-iterates that per hs Mali score he does not require anticoagulation. He was also seen by Dr.Wrenn [facility] this year and reports that he had his prostate evaluated by Urology. Since his Cystoscopy he has had no urinary problems.   Discussed advance directive with patient and he does not want to make a decision at this time but will take the information home and discuss with his family.   He has no other c/o except that he now has worsening symptoms in the BUE with regard to numbness. He was ordered an MRI of the Cervical spine in April but did not have it dose. He states that he has been having subjective feelings of weakness in BUE's and more numbness and tingling. He also reports that he has difficulty turning from side to side while driving and this creates difficulty with visualizing traffic.    Pt has never had a Pneumococcal, zostavax or influenza vaccine. His last colonoscopy was about 20 years ago.   Patient has No headache, No chest pain, No abdominal pain - No Nausea, No new weakness tingling or numbness, No Cough - SOB.  Allergies  Allergen Reactions  . Bee Venom     swelling   Past Medical History  Diagnosis Date  . Essential hypertension, benign   . GERD (gastroesophageal reflux disease)   . Atrial fibrillation   . Prostatic hypertrophy   . Arthritis    Current Outpatient Prescriptions on File Prior to Visit  Medication Sig Dispense Refill  . aspirin EC 81 MG tablet Take 81 mg by mouth daily.    . Digestive Enzymes CAPS Take 1 capsule by mouth daily.     Marland Kitchen docusate  sodium (COLACE) 100 MG capsule Take 100 mg by mouth daily.     Marland Kitchen EPINEPHrine (EPI-PEN) 0.3 mg/0.3 mL SOAJ injection Inject 0.3 mLs (0.3 mg total) into the muscle once. 1 Device 1  . hydrochlorothiazide (MICROZIDE) 12.5 MG capsule TAKE 1 CAPSULE BY MOUTH EVERY MORNING. 30 capsule 3  . mometasone (NASONEX) 50 MCG/ACT nasal spray Place 4 sprays into the nose daily.    . simvastatin (ZOCOR) 20 MG tablet Take 1 tablet (20 mg total) by mouth at bedtime. 90 tablet 3   No current facility-administered medications on file prior to visit.   Family History  Problem Relation Age of Onset  . Hypertension Mother   . Hypertension Father    History   Social History  . Marital Status: Married    Spouse Name: N/A    Number of Children: N/A  . Years of Education: N/A   Occupational History  . Not on file.   Social History Main Topics  . Smoking status: Never Smoker   . Smokeless tobacco: Never Used  . Alcohol Use: No  . Drug Use: No  . Sexual Activity: Not on file   Other Topics Concern  . Not on file   Social History Narrative  . No narrative on file    Review of Systems: Constitutional: Negative  for fever, chills, diaphoresis, activity change, appetite change and fatigue. HENT: Negative for ear pain, nosebleeds, congestion, facial swelling, rhinorrhea, neck pain, neck stiffness and ear discharge.  Eyes: Negative for pain, discharge, redness, itching and visual disturbance. Respiratory: Negative for cough, choking, chest tightness, shortness of breath, wheezing and stridor.  Cardiovascular: Negative for chest pain, palpitations and leg swelling. Gastrointestinal: Negative for abdominal distention. Genitourinary: Negative for dysuria, urgency, frequency, hematuria, flank pain, decreased urine volume, difficulty urinating.  Musculoskeletal: Negative for back pain, joint swelling, arthralgia and gait problem. Neurological: Negative for dizziness, tremors, seizures, syncope, facial  asymmetry, speech difficulty, weakness, light-headedness, numbness and headaches.  Hematological: Negative for adenopathy. Does not bruise/bleed easily. Psychiatric/Behavioral: Negative for hallucinations, behavioral problems, confusion, dysphoric mood, decreased concentration and agitation.    Objective:   Filed Vitals:   06/29/14 1500  BP: 149/97  Pulse: 56  Temp: 97.9 F (36.6 C)  Resp: 16    Physical Exam: Constitutional: Patient appears well-developed and well-nourished. No distress. HENT: Normocephalic, atraumatic, External right and left ear normal. Oropharynx is clear and moist.  Eyes: Conjunctivae and EOM are normal. PERRLA, no scleral icterus. Neck: Normal ROM. Neck supple. No JVD. No tracheal deviation. No thyromegaly. CVS: RRR, S1/S2 +, no murmurs, no gallops, no carotid bruit.  Pulmonary: Effort and breath sounds normal, no stridor, rhonchi, wheezes, rales.  Abdominal: Soft. BS +, no distension, tenderness, rebound or guarding.  Musculoskeletal: Normal range of motion. No edema and no tenderness.  Lymphadenopathy: No lymphadenopathy noted, cervical, inguinal or axillary Neuro: Alert. Normal reflexes, muscle tone coordination. No cranial nerve deficit. Skin: Skin is warm and dry. No rash noted. Not diaphoretic. No erythema. No pallor. Psychiatric: Normal mood and affect. Behavior, judgment, thought content normal.  Lab Results  Component Value Date   WBC 5.6 08/17/2013   HGB 15.4 08/17/2013   HCT 43.9 08/17/2013   MCV 88.9 08/17/2013   PLT 168 08/17/2013   Lab Results  Component Value Date   CREATININE 1.29 03/17/2014   BUN 16 03/17/2014   NA 140 03/17/2014   K 4.0 03/17/2014   CL 106 03/17/2014   CO2 27 03/17/2014    No results found for: HGBA1C Lipid Panel     Component Value Date/Time   CHOL 184 08/14/2013 0943   TRIG 95 08/14/2013 0943   HDL 49 08/14/2013 0943   CHOLHDL 3.8 08/14/2013 0943   VLDL 19 08/14/2013 0943   LDLCALC 116* 08/14/2013  0943       Assessment and plan:   1. Essential hypertension, benign - BP mildly elevated will increase HCTZ to 25 mg daily. Re-check BP in 4 weeks. - potassium chloride (K-DUR) 10 MEQ tablet; TAKE (1) TABLET BY MOUTH ONCE DAILY.  Dispense: 90 tablet; Refill: 3 - lisinopril (PRINIVIL,ZESTRIL) 20 MG tablet; TAKE 1 TABLET BY MOUTH EVERY MORNING.  Dispense: 90 tablet; Refill: 3 - amLODipine (NORVASC) 5 MG tablet; TAKE 1 TABLET BY MOUTH EVERY MORNING.  Dispense: 90 tablet; Refill: 3 - Urinalysis for proteinuria - Ambulatory referral to Ophthalmology  2. Chronic atrial fibrillation - HR controlled Mali score still 2.  - Continue ASA. - Check Magnesium  3. Immunization due - Varicella-zoster vaccine subcutaneous - Flu Vaccine QUAD 36+ mos PF IM (Fluarix Quad PF) - Pneumococcal polysaccharide vaccine 23-valent greater than or equal to 2yo subcutaneous/IM  4. Annual physical exam - Refer for colonoscopy - Refer for Opthalmology examination. Last examination 3 years ago. - Comprehensive metabolic panel - Urinalysis  5. Upper extremity weakness -  Pt having progressive symptoms of UE weakness and Parasthesias. He also has decreased patellar reflex on left. He was referred for an MRI of the neck in April but did not follow through. Will refer again for MRI cervical spine. - MR Cervical Spine Wo Contrast  6. Cervical spine degeneration - MR Cervical Spine Wo Contrast  7. Hyperlipidemia - Currently on Simvastatin. Will re-check lipid panel today - Lipid Panel  8. Sensory deficit, left - MR Cervical Spine Wo Contrast    Follow-up in 3 months.  The patient was given clear instructions to go to ER or return to medical center if symptoms don't improve, worsen or new problems develop. The patient verbalized understanding. The patient was told to call to get lab results if they haven't heard anything in the next week.     This note has been created with Designer, industrial/product. Any transcriptional errors are unintentional.    Blair Lundeen A., MD [place], [place]   06/29/2014, 3:57 PM

## 2014-06-30 LAB — URINALYSIS
BILIRUBIN URINE: NEGATIVE
Glucose, UA: NEGATIVE mg/dL
Hgb urine dipstick: NEGATIVE
Ketones, ur: NEGATIVE mg/dL
LEUKOCYTES UA: NEGATIVE
NITRITE: NEGATIVE
Protein, ur: NEGATIVE mg/dL
SPECIFIC GRAVITY, URINE: 1.013 (ref 1.005–1.030)
Urobilinogen, UA: 0.2 mg/dL (ref 0.0–1.0)
pH: 7 (ref 5.0–8.0)

## 2014-07-01 LAB — MAGNESIUM: MAGNESIUM: 1.9 mg/dL (ref 1.5–2.5)

## 2014-10-07 ENCOUNTER — Ambulatory Visit (INDEPENDENT_AMBULATORY_CARE_PROVIDER_SITE_OTHER): Payer: Medicare Other | Admitting: Internal Medicine

## 2014-10-07 ENCOUNTER — Encounter: Payer: Self-pay | Admitting: Internal Medicine

## 2014-10-07 VITALS — BP 131/82 | HR 64 | Temp 97.7°F | Resp 16 | Ht 72.0 in | Wt 197.0 lb

## 2014-10-07 DIAGNOSIS — Z1211 Encounter for screening for malignant neoplasm of colon: Secondary | ICD-10-CM | POA: Diagnosis not present

## 2014-10-07 DIAGNOSIS — M5410 Radiculopathy, site unspecified: Secondary | ICD-10-CM

## 2014-10-07 DIAGNOSIS — I10 Essential (primary) hypertension: Secondary | ICD-10-CM | POA: Diagnosis not present

## 2014-10-07 LAB — BASIC METABOLIC PANEL
BUN: 16 mg/dL (ref 6–23)
CO2: 27 meq/L (ref 19–32)
CREATININE: 1.37 mg/dL — AB (ref 0.50–1.35)
Calcium: 9.4 mg/dL (ref 8.4–10.5)
Chloride: 106 mEq/L (ref 96–112)
Glucose, Bld: 79 mg/dL (ref 70–99)
Potassium: 3.7 mEq/L (ref 3.5–5.3)
Sodium: 142 mEq/L (ref 135–145)

## 2014-10-07 LAB — MAGNESIUM: Magnesium: 2 mg/dL (ref 1.5–2.5)

## 2014-10-07 NOTE — Progress Notes (Signed)
Patient ID: Travis Santos, male   DOB: [birth year], 74 y.o.   MRN: [id number]  HPI 74 y.o. male  presents for 3 month follow up with hypertension, hyperlipidemia, prediabetes and vitamin D. His blood pressure has been controlled at home, today their BP is BP: 131/82 mmHg He does not workout. He denies chest pain, shortness of breath, dizziness.  He is not on cholesterol medication. His cholesterol is at goal. The cholesterol last visit was:   Lab Results  Component Value Date   CHOL 135 06/29/2014   HDL 47 06/29/2014   LDLCALC 69 06/29/2014   TRIG 96 06/29/2014   CHOLHDL 2.9 06/29/2014   He has not been working on diet and exercise  No results found for: HGBA1C Patient is not on Vitamin D supplement.   No results found for: VD25OH     Pt also continues to complain of pain in neck which has improved somewhat with exercises, however he is now having pain down the BUE's which is worse in the morning but persists throughout the day. He denies noticing weakness of the BUE's.   Pt is overdue for Colon Cancer screening. He has been referred for colonoscopy but has not followed up because he did not think that he needed it at his age.  Current Medications:  Current Outpatient Prescriptions on File Prior to Visit  Medication Sig Dispense Refill  . amLODipine (NORVASC) 5 MG tablet TAKE 1 TABLET BY MOUTH EVERY MORNING. 90 tablet 3  . aspirin EC 81 MG tablet Take 81 mg by mouth daily.    . Digestive Enzymes CAPS Take 1 capsule by mouth daily.     Marland Kitchen docusate sodium (COLACE) 100 MG capsule Take 100 mg by mouth daily.     Marland Kitchen EPINEPHrine (EPI-PEN) 0.3 mg/0.3 mL SOAJ injection Inject 0.3 mLs (0.3 mg total) into the muscle once. 1 Device 1  . hydrochlorothiazide (HYDRODIURIL) 25 MG tablet Take 1 tablet (25 mg total) by mouth daily. 90 tablet 3  . lisinopril (PRINIVIL,ZESTRIL) 20 MG tablet TAKE 1 TABLET BY MOUTH EVERY MORNING. 90 tablet 3  . mometasone (NASONEX) 50 MCG/ACT nasal spray Place 4 sprays  into the nose daily.    . potassium chloride (K-DUR) 10 MEQ tablet TAKE (1) TABLET BY MOUTH ONCE DAILY. 90 tablet 3  . simvastatin (ZOCOR) 20 MG tablet Take 1 tablet (20 mg total) by mouth at bedtime. 90 tablet 3   No current facility-administered medications on file prior to visit.   Medical History:  Past Medical History  Diagnosis Date  . Essential hypertension, benign   . GERD (gastroesophageal reflux disease)   . Atrial fibrillation   . Prostatic hypertrophy   . Arthritis    Allergies:  Allergies  Allergen Reactions  . Bee Venom     swelling     Review of Systems:  Review of Systems  Constitutional: Negative.   HENT: Negative.   Eyes: Negative.   Respiratory: Negative.   Cardiovascular: Negative.   Gastrointestinal: Negative.   Genitourinary: Negative.   Musculoskeletal: Positive for joint pain.       Pt continues to have neck pain and now has pain radiating down left arm.  Skin: Negative.   Neurological: Negative.   Endo/Heme/Allergies: Negative.   Psychiatric/Behavioral: Negative.      Family history- Review and unchanged Social history- Review and unchanged Physical Exam: BP 131/82 mmHg  Pulse 64  Temp(Src) 97.7 F (36.5 C) (Oral)  Resp 16  Ht 6' (1.829 m)  Wt 197 lb (89.359 kg)  BMI 26.71 kg/m2 Wt Readings from Last 3 Encounters:  10/07/14 197 lb (89.359 kg)  06/29/14 202 lb (91.627 kg)  04/06/14 200 lb (90.719 kg)   General Appearance: Well nourished, in no apparent distress. Eyes: PERRLA, EOMs, conjunctiva no swelling or erythema Sinuses: No Frontal/maxillary tenderness ENT/Mouth: Ext aud canals clear, TMs without erythema, bulging. No erythema, swelling, or exudate on post pharynx.  Tonsils not swollen or erythematous. Hearing normal.  Neck: Supple, thyroid normal.  Respiratory: Respiratory effort normal, BS equal bilaterally without rales, rhonchi, wheezing or stridor.  Cardio: RRR with no MRGs. Brisk peripheral pulses without edema.   Abdomen: Soft, + BS.  Non tender, no guarding, rebound, hernias, masses. Lymphatics: Non tender without lymphadenopathy.  Musculoskeletal: Normal gait. Pt has a posture of forward neck and mild scoliosis. He does have limited Cervical ROM in the extension, and rotation plane.s He also has decreased ROM in B/L shoulders in the direction of flexion. Skin: Warm, dry without rashes, lesions, ecchymosis.  Neuro: Cranial nerves intact. Normal muscle tone, no cerebellar symptoms. Sensation intact.  Psych: Awake and oriented X 3, normal affect, Insight and Judgment appropriate.   Assessment and Plan:  1. Essential hypertension, benign - Pt has had improvement in BP since starting Norvasc. He denies any dizziness and has no near syncopal episodes or peripheral edema. I will continue current medications. We have discussed increasing Norvasc to 7.5 to achieve goal of SBP of 120 in light of new recommendations after SPRINT trial. However he wants to wait for now and re check BP in 3 months. If still not at goal will increase Norvasc to 7.5 mg.  - Basic Metabolic Panel - Magnesium - Urinalysis - Orthostatic Vital Signs - Continue medication, monitor blood pressure at home. Continue DASH diet.  Reminder to go to the ER if any CP, SOB, nausea, dizziness, severe HA, changes vision/speech, left arm numbness and tingling, and jaw pain.   2.Colon cancer screening - I have had a discussion with Mr. Knoop regarding risk particularly in a patient who has had polyps in the past. I will refer to Dr. Oneida Alar in [place] as pt resides in [place].  - Ambulatory referral to Gastroenterology   3. Radiculopathy affecting upper extremity - Pt likely has cervical stenosis with some degree of radiculopathy. He has had an order for MRI placed for several months and he has not completed the MRI. Will re-schedule MRI and follow up once results received.   Continue diet and meds as discussed. Further disposition  pending results of labs.  Mahima Hottle A., MD 10:30 AM [facility]

## 2014-10-08 LAB — URINALYSIS
Bilirubin Urine: NEGATIVE
Glucose, UA: NEGATIVE mg/dL
Hgb urine dipstick: NEGATIVE
KETONES UR: NEGATIVE mg/dL
Leukocytes, UA: NEGATIVE
Nitrite: NEGATIVE
Protein, ur: NEGATIVE mg/dL
Specific Gravity, Urine: 1.02 (ref 1.005–1.030)
UROBILINOGEN UA: 0.2 mg/dL (ref 0.0–1.0)
pH: 6 (ref 5.0–8.0)

## 2014-10-19 ENCOUNTER — Telehealth: Payer: Self-pay

## 2014-10-19 ENCOUNTER — Ambulatory Visit (HOSPITAL_COMMUNITY)
Admission: RE | Admit: 2014-10-19 | Discharge: 2014-10-19 | Disposition: A | Discharge status: Home / Self Care | Payer: Medicare Other | Source: Ambulatory Visit | Attending: Internal Medicine | Admitting: Internal Medicine

## 2014-10-19 DIAGNOSIS — M5031 Other cervical disc degeneration,  high cervical region: Secondary | ICD-10-CM | POA: Diagnosis not present

## 2014-10-19 DIAGNOSIS — Z923 Personal history of irradiation: Secondary | ICD-10-CM | POA: Insufficient documentation

## 2014-10-19 DIAGNOSIS — M4802 Spinal stenosis, cervical region: Secondary | ICD-10-CM | POA: Insufficient documentation

## 2014-10-19 DIAGNOSIS — M25512 Pain in left shoulder: Secondary | ICD-10-CM | POA: Diagnosis not present

## 2014-10-19 DIAGNOSIS — M5032 Other cervical disc degeneration, mid-cervical region: Secondary | ICD-10-CM | POA: Diagnosis not present

## 2014-10-19 DIAGNOSIS — M542 Cervicalgia: Secondary | ICD-10-CM | POA: Diagnosis present

## 2014-10-19 DIAGNOSIS — M25511 Pain in right shoulder: Secondary | ICD-10-CM | POA: Insufficient documentation

## 2014-10-19 NOTE — Telephone Encounter (Signed)
Pt came by the office to schedule screening colonoscopy. He was referred by [place]. He said he had one about 20 years ago by Dr. Lindalou Hose at [facility]. Manuela Schwartz is checking on that.

## 2014-10-20 NOTE — Telephone Encounter (Signed)
Received report from [place]. Pt had last colonoscopy 02/07/2004 by Dr. Lindalou Hose.  He had a tubular adenoma.  Needs OV prior to scheduling colonoscopy.  LMOM to call and also mailed a letter to call and schedule OV appt.

## 2014-10-25 ENCOUNTER — Telehealth: Payer: Self-pay

## 2014-10-25 NOTE — Telephone Encounter (Signed)
Pt received a letter from DS that we had received his last colonoscopy report from [facility] and would need OV prior to scheduling his tcs. Since patient will be a new patient will we still need referral from his PCP or is the records we have from [facility] ok?  Please advise and call patient back at [phone] DS is aware and said that she wants to speak with patient first before I schedule OV

## 2014-10-26 NOTE — Telephone Encounter (Signed)
Pt's referral was sent through the workqueue.

## 2014-10-28 NOTE — Telephone Encounter (Signed)
LMOM to call to schedule OV appt.

## 2014-11-16 ENCOUNTER — Telehealth: Payer: Self-pay

## 2014-11-16 NOTE — Telephone Encounter (Signed)
Patient called to set up his tcs. He had received a letter from DS. Please call him at [phone]

## 2014-11-16 NOTE — Telephone Encounter (Signed)
Pt received a letter from DS to set up tcs. Please call him at [phone]

## 2014-11-18 NOTE — Telephone Encounter (Signed)
Pt's last colonoscopy was 02/07/2004 by Dr. Lindalou Hose in [place], [place]. He has hx of tubular adenoma. OV scheduled with Laban Emperor, NP on 12/08/2014 at 9:00 Am.

## 2014-11-18 NOTE — Telephone Encounter (Signed)
See note of 10/19/2014.

## 2014-12-08 ENCOUNTER — Ambulatory Visit (INDEPENDENT_AMBULATORY_CARE_PROVIDER_SITE_OTHER): Payer: Medicare Other | Admitting: Gastroenterology

## 2014-12-08 ENCOUNTER — Encounter: Payer: Self-pay | Admitting: Gastroenterology

## 2014-12-08 ENCOUNTER — Other Ambulatory Visit: Payer: Self-pay

## 2014-12-08 VITALS — BP 154/94 | HR 52 | Temp 97.4°F | Ht 72.0 in | Wt 200.0 lb

## 2014-12-08 DIAGNOSIS — D369 Benign neoplasm, unspecified site: Secondary | ICD-10-CM | POA: Insufficient documentation

## 2014-12-08 DIAGNOSIS — Z86010 Personal history of colonic polyps: Secondary | ICD-10-CM

## 2014-12-08 MED ORDER — PEG 3350-KCL-NA BICARB-NACL 420 G PO SOLR: 4000.0000 mL | ORAL | Status: DC

## 2014-12-08 NOTE — Assessment & Plan Note (Signed)
74 year old male with history of tubular adenoma in 2005 at [facility], due for surveillance. No concerning lower or upper GI symptoms.   Proceed with colonoscopy with Dr. Oneida Alar in the near future. The risks, benefits, and alternatives have been discussed in detail with the patient. They state understanding and desire to proceed.

## 2014-12-08 NOTE — Progress Notes (Signed)
Primary Care Physician:  MATTHEWS,MICHELLE A., MD Primary Gastroenterologist:  Dr. Oneida Alar   Chief Complaint  Patient presents with  . set up TCS    HPI:   Travis Santos is a 74 y.o. male presenting today at the request of Dr. Zigmund Daniel to arrange a surveillance colonoscopy. Last lower GI evaluation by Dr. Lindalou Hose in 2005 with tubular adenoma.   No hematochezia, no melena. No abdominal pain. No changes in bowel habits. No constipation or diarrhea. Takes stool softener, which prevents constipation for him. Digestive enzymes caps takes daily. Has a lot of gas at times. Drinks about 1 soda a day.   Has had coughing spells lately and feels sore upper abdomen.   Past Medical History  Diagnosis Date  . Essential hypertension, benign   . GERD (gastroesophageal reflux disease)   . Atrial fibrillation   . Prostatic hypertrophy   . Arthritis   . Pituitary tumor     followed at [facility]    Past Surgical History  Procedure Laterality Date  . Nasal polyp surgery  2007  . Tumor removal  2006    Tumor removed from left clavical area, "softball size"  . Tumor removal  2014    on nose, radiation treatments  . Vein surgery Bilateral 10 years ago    "having trouble with phlebitis so dried up veins"  . Hydrocele excision Right 08/25/2013    Procedure: HYDROCELECTOMY ADULT;  Surgeon: Irine Seal, MD;  Location: [facility];  Service: Urology;  Laterality: Right;  . Transurethral resection of prostate N/A 08/25/2013    Procedure: TRANSURETHRAL RESECTION OF THE PROSTATE WITH GYRUS INSTRUMENTS;  Surgeon: Irine Seal, MD;  Location: [facility];  Service: Urology;  Laterality: N/A;  . Colonoscopy  02/07/04    Dr. Lindalou Hose: tubular adenoma    Current Outpatient Prescriptions  Medication Sig Dispense Refill  . amLODipine (NORVASC) 5 MG tablet TAKE 1 TABLET BY MOUTH EVERY MORNING. 90 tablet 3  . aspirin EC 81 MG tablet Take 81 mg by mouth daily.    . Digestive Enzymes CAPS Take 1 capsule by mouth  daily.     Marland Kitchen docusate sodium (COLACE) 100 MG capsule Take 100 mg by mouth daily.     Marland Kitchen EPINEPHrine (EPI-PEN) 0.3 mg/0.3 mL SOAJ injection Inject 0.3 mLs (0.3 mg total) into the muscle once. 1 Device 1  . hydrochlorothiazide (HYDRODIURIL) 25 MG tablet Take 1 tablet (25 mg total) by mouth daily. 90 tablet 3  . lisinopril (PRINIVIL,ZESTRIL) 20 MG tablet TAKE 1 TABLET BY MOUTH EVERY MORNING. 90 tablet 3  . mometasone (NASONEX) 50 MCG/ACT nasal spray Place 4 sprays into the nose daily. States he only uses as needed    . potassium chloride (K-DUR) 10 MEQ tablet TAKE (1) TABLET BY MOUTH ONCE DAILY. 90 tablet 3  . simvastatin (ZOCOR) 20 MG tablet Take 1 tablet (20 mg total) by mouth at bedtime. 90 tablet 3   No current facility-administered medications for this visit.    Allergies as of 12/08/2014 - Review Complete 12/08/2014  Allergen Reaction Noted  . Bee venom  08/13/2013    Family History  Problem Relation Age of Onset  . Hypertension Mother   . Stroke Mother   . Hypertension Father   . Hypertension Sister   . Stroke Brother   . Hypertension Daughter   . Diabetes Son   . Colon cancer Neg Hx     History   Social History  . Marital Status: Married  Spouse Name: N/A  . Number of Children: N/A  . Years of Education: N/A   Occupational History  . Not on file.   Social History Main Topics  . Smoking status: Never Smoker   . Smokeless tobacco: Never Used  . Alcohol Use: No  . Drug Use: No  . Sexual Activity: Not on file   Other Topics Concern  . Not on file   Social History Narrative    Review of Systems: Gen: Denies any fever, chills, fatigue, weight loss, lack of appetite.  CV: Denies chest pain, heart palpitations, peripheral edema, syncope.  Resp: Denies shortness of breath at rest or with exertion. Denies wheezing or cough.  GI: see HPI GU : Denies urinary burning, urinary frequency, urinary hesitancy MS: +joint pain Derm: Denies rash, itching, dry skin Psych:  Denies depression, anxiety, memory loss, and confusion Heme: Denies bruising, bleeding, and enlarged lymph nodes.  Physical Exam: BP 154/94 mmHg  Pulse 52  Temp(Src) 97.4 F (36.3 C)  Ht 6' (1.829 m)  Wt 200 lb (90.719 kg)  BMI 27.12 kg/m2 General:   Alert and oriented. Pleasant and cooperative. Well-nourished and well-developed.  Head:  Normocephalic and atraumatic. Eyes:  Without icterus, sclera clear and conjunctiva pink.  Ears:  Normal auditory acuity. Nose:  No deformity, discharge,  or lesions. Mouth:  No deformity or lesions, oral mucosa pink.  Neck:  Supple, without mass or thyromegaly. Lungs:  Clear to auscultation bilaterally. No wheezes, rales, or rhonchi. No distress.  Heart:  S1, S2 present, irregular Abdomen:  +BS, soft, non-tender and non-distended. No HSM noted. Query rectus diastasis. No guarding or rebound. No masses appreciated.  Rectal:  Deferred  Msk:  Symmetrical without gross deformities. Normal posture. Extremities:  Without edema. Neurologic:  Alert and  oriented x4;  grossly normal neurologically. Psych:  Alert and cooperative. Normal mood and affect.

## 2014-12-08 NOTE — Patient Instructions (Signed)
We have scheduled you for a colonoscopy with Dr. Fields  in the near future.  

## 2014-12-15 NOTE — Progress Notes (Signed)
cc'ed to pcp °

## 2014-12-31 ENCOUNTER — Ambulatory Visit (HOSPITAL_COMMUNITY)
Admission: RE | Admit: 2014-12-31 | Discharge: 2014-12-31 | Disposition: A | Discharge status: Home / Self Care | Payer: Medicare Other | Source: Ambulatory Visit | Attending: Gastroenterology | Admitting: Gastroenterology

## 2014-12-31 ENCOUNTER — Encounter (HOSPITAL_COMMUNITY): Payer: Self-pay | Admitting: *Deleted

## 2014-12-31 ENCOUNTER — Encounter (HOSPITAL_COMMUNITY)
Admission: RE | Disposition: A | Discharge status: Home / Self Care | Payer: Self-pay | Source: Ambulatory Visit | Attending: Gastroenterology

## 2014-12-31 DIAGNOSIS — I10 Essential (primary) hypertension: Secondary | ICD-10-CM | POA: Insufficient documentation

## 2014-12-31 DIAGNOSIS — D123 Benign neoplasm of transverse colon: Secondary | ICD-10-CM | POA: Diagnosis not present

## 2014-12-31 DIAGNOSIS — Z79899 Other long term (current) drug therapy: Secondary | ICD-10-CM | POA: Insufficient documentation

## 2014-12-31 DIAGNOSIS — D120 Benign neoplasm of cecum: Secondary | ICD-10-CM | POA: Insufficient documentation

## 2014-12-31 DIAGNOSIS — D125 Benign neoplasm of sigmoid colon: Secondary | ICD-10-CM

## 2014-12-31 DIAGNOSIS — I4891 Unspecified atrial fibrillation: Secondary | ICD-10-CM | POA: Insufficient documentation

## 2014-12-31 DIAGNOSIS — D128 Benign neoplasm of rectum: Secondary | ICD-10-CM | POA: Diagnosis not present

## 2014-12-31 DIAGNOSIS — Z860100 Personal history of colon polyps, unspecified: Secondary | ICD-10-CM

## 2014-12-31 DIAGNOSIS — Z7982 Long term (current) use of aspirin: Secondary | ICD-10-CM | POA: Insufficient documentation

## 2014-12-31 DIAGNOSIS — Z09 Encounter for follow-up examination after completed treatment for conditions other than malignant neoplasm: Secondary | ICD-10-CM | POA: Diagnosis present

## 2014-12-31 DIAGNOSIS — N400 Enlarged prostate without lower urinary tract symptoms: Secondary | ICD-10-CM | POA: Insufficient documentation

## 2014-12-31 DIAGNOSIS — M1990 Unspecified osteoarthritis, unspecified site: Secondary | ICD-10-CM | POA: Insufficient documentation

## 2014-12-31 DIAGNOSIS — K648 Other hemorrhoids: Secondary | ICD-10-CM | POA: Insufficient documentation

## 2014-12-31 DIAGNOSIS — K219 Gastro-esophageal reflux disease without esophagitis: Secondary | ICD-10-CM | POA: Insufficient documentation

## 2014-12-31 HISTORY — PX: COLONOSCOPY: SHX5424

## 2014-12-31 SURGERY — COLONOSCOPY
Anesthesia: Moderate Sedation

## 2014-12-31 MED ORDER — MIDAZOLAM HCL 5 MG/5ML IJ SOLN
INTRAMUSCULAR | Status: DC | PRN
  Administered 2014-12-31: 1 mg via INTRAVENOUS
  Administered 2014-12-31: 2 mg via INTRAVENOUS

## 2014-12-31 MED ORDER — HYDRALAZINE HCL 20 MG/ML IJ SOLN
INTRAMUSCULAR | Status: AC
  Filled 2014-12-31: qty 1

## 2014-12-31 MED ORDER — MEPERIDINE HCL 100 MG/ML IJ SOLN
INTRAMUSCULAR | Status: AC
  Filled 2014-12-31: qty 2

## 2014-12-31 MED ORDER — MEPERIDINE HCL 100 MG/ML IJ SOLN
INTRAMUSCULAR | Status: DC | PRN
  Administered 2014-12-31: 25 mg via INTRAVENOUS

## 2014-12-31 MED ORDER — ATROPINE SULFATE 1 MG/ML IJ SOLN
INTRAMUSCULAR | Status: DC | PRN
  Administered 2014-12-31: .5 mg via INTRAVENOUS

## 2014-12-31 MED ORDER — MIDAZOLAM HCL 5 MG/5ML IJ SOLN
INTRAMUSCULAR | Status: AC
  Filled 2014-12-31: qty 10

## 2014-12-31 MED ORDER — SODIUM CHLORIDE 0.9 % IV SOLN
INTRAVENOUS | Status: DC
  Administered 2014-12-31: 11:00:00 via INTRAVENOUS

## 2014-12-31 MED ORDER — ATROPINE SULFATE 1 MG/ML IJ SOLN
INTRAMUSCULAR | Status: AC
  Filled 2014-12-31: qty 1

## 2014-12-31 NOTE — Progress Notes (Signed)
REVIEWED-NO ADDITIONAL RECOMMENDATIONS. 

## 2014-12-31 NOTE — Op Note (Signed)
[facility] [address] [place], [address]   COLONOSCOPY PROCEDURE REPORT  PATIENT: Travis Santos, Travis Santos  MR#: [id number] BIRTHDATE: 1941-06-19 , 89  yrs. old GENDER: male ENDOSCOPIST: Danie Binder, MD REFERRED BY: PROCEDURE DATE:  Jan 16, 2015 PROCEDURE:   Colonoscopy with cold biopsy polypectomy and Colonoscopy with snare polypectomy INDICATIONS:high risk patient with personal history of colonic polyps. MEDICATIONS: Demerol 25 mg IV, Versed 4 mg IV, and Atropine .5 mg IV   DESCRIPTION OF PROCEDURE:    Physical exam was performed.  Informed consent was obtained from the patient after explaining the benefits, risks, and alternatives to procedure.  The patient was connected to monitor and placed in left lateral position. Continuous oxygen was provided by nasal cannula and IV medicine administered through an indwelling cannula.  After administration of sedation and rectal exam, the patients rectum was intubated and the EC-[id number] ([id number])  colonoscope was advanced under direct visualization to the cecum.  The scope was removed slowly by carefully examining the color, texture, anatomy, and integrity mucosa on the way out.  The patient was recovered in endoscopy and discharged home in satisfactory condition.    COLON FINDINGS: Four sessile polyps ranging from 2 to 72mm in size were found in the sigmoid colon(1), rectum(1), at the cecum(1), and in the proximal transverse colon.  A polypectomy was performed with cold forceps.  , A sessile polyp ranging from 6 to 30mm in size was found in the rectum.  A polypectomy was performed using snare cautery.  , and Small internal hemorrhoids were found.  PREP QUALITY: excellent.  CECAL W/D TIME: 17       minutes COMPLICATIONS: None  ENDOSCOPIC IMPRESSION: 1.   FIVE COLORECTAL  polyps removed 2.   Small internal hemorrhoids  RECOMMENDATIONS: FOLLOW A HIGH FIBER DIET.  AVOID ITEMS THAT CAUSE BLOATING & GAS. AWIAT BIOPSY  RESULTS. Next colonoscopy in 5-10 years.     _______________________________ eSignedDanie Binder, MD 2015/01/16 1:25 PM    CPT CODES: ICD CODES:  The ICD and CPT codes recommended by this software are interpretations from the data that the clinical staff has captured with the software.  The verification of the translation of this report to the ICD and CPT codes and modifiers is the sole responsibility of the health care institution and practicing physician where this report was generated.  [employer] will not be held responsible for the validity of the ICD and CPT codes included on this report.  AMA assumes no liability for data contained or not contained herein. CPT is a Designer, television/film set of the [employer].

## 2014-12-31 NOTE — Discharge Instructions (Signed)
You had 5 polyps removed. You have internal hemorrhoids.   FOLLOW A HIGH FIBER DIET. AVOID ITEMS THAT CAUSE BLOATING & GAS. SEE INFO BELOW.  YOUR BIOPSY RESULTS WILL BE AVAILABLE IN MY CHART AFTER MAY 9 AND MY OFFICE WILL CONTACT YOU IN 10-14 DAYS WITH YOUR RESULTS.   Next colonoscopy in 5-10 years.     Colonoscopy Care After Read the instructions outlined below and refer to this sheet in the next week. These discharge instructions provide you with general information on caring for yourself after you leave the hospital. While your treatment has been planned according to the most current medical practices available, unavoidable complications occasionally occur. If you have any problems or questions after discharge, call DR. FIELDS, [phone].  ACTIVITY  You may resume your regular activity, but move at a slower pace for the next 24 hours.   Take frequent rest periods for the next 24 hours.   Walking will help get rid of the air and reduce the bloated feeling in your belly (abdomen).   No driving for 24 hours (because of the medicine (anesthesia) used during the test).   You may shower.   Do not sign any important legal documents or operate any machinery for 24 hours (because of the anesthesia used during the test).    NUTRITION  Drink plenty of fluids.   You may resume your normal diet as instructed by your doctor.   Begin with a light meal and progress to your normal diet. Heavy or fried foods are harder to digest and may make you feel sick to your stomach (nauseated).   Avoid alcoholic beverages for 24 hours or as instructed.    MEDICATIONS  You may resume your normal medications.   WHAT YOU CAN EXPECT TODAY  Some feelings of bloating in the abdomen.   Passage of more gas than usual.   Spotting of blood in your stool or on the toilet paper  .  IF YOU HAD POLYPS REMOVED DURING THE COLONOSCOPY:  Eat a soft diet IF YOU HAVE NAUSEA, BLOATING, ABDOMINAL PAIN,  OR VOMITING.    FINDING OUT THE RESULTS OF YOUR TEST Not all test results are available during your visit. DR. Oneida Alar WILL CALL YOU WITHIN 14 DAYS OF YOUR PROCEDUE WITH YOUR RESULTS. Do not assume everything is normal if you have not heard from DR. FIELDS, CALL HER OFFICE AT [phone].  SEEK IMMEDIATE MEDICAL ATTENTION AND CALL THE OFFICE: [phone] IF:  You have more than a spotting of blood in your stool.   Your belly is swollen (abdominal distention).   You are nauseated or vomiting.   You have a temperature over 101F.   You have abdominal pain or discomfort that is severe or gets worse throughout the day.   High-Fiber Diet A high-fiber diet changes your normal diet to include more whole grains, legumes, fruits, and vegetables. Changes in the diet involve replacing refined carbohydrates with unrefined foods. The calorie level of the diet is essentially unchanged. The Dietary Reference Intake (recommended amount) for adult males is 38 grams per day. For adult females, it is 25 grams per day. Pregnant and lactating women should consume 28 grams of fiber per day. Fiber is the intact part of a plant that is not broken down during digestion. Functional fiber is fiber that has been isolated from the plant to provide a beneficial effect in the body. PURPOSE  Increase stool bulk.   Ease and regulate bowel movements.   Lower cholesterol.  INDICATIONS THAT YOU NEED MORE FIBER  Constipation and hemorrhoids.   Uncomplicated diverticulosis (intestine condition) and irritable bowel syndrome.   Weight management.   As a protective measure against hardening of the arteries (atherosclerosis), diabetes, and cancer.   GUIDELINES FOR INCREASING FIBER IN THE DIET  Start adding fiber to the diet slowly. A gradual increase of about 5 more grams (2 slices of whole-wheat bread, 2 servings of most fruits or vegetables, or 1 bowl of high-fiber cereal) per day is best. Too rapid an increase in  fiber may result in constipation, flatulence, and bloating.   Drink enough water and fluids to keep your urine clear or pale yellow. Water, juice, or caffeine-free drinks are recommended. Not drinking enough fluid may cause constipation.   Eat a variety of high-fiber foods rather than one type of fiber.   Try to increase your intake of fiber through using high-fiber foods rather than fiber pills or supplements that contain small amounts of fiber.   The goal is to change the types of food eaten. Do not supplement your present diet with high-fiber foods, but replace foods in your present diet.  INCLUDE A VARIETY OF FIBER SOURCES  Replace refined and processed grains with whole grains, canned fruits with fresh fruits, and incorporate other fiber sources. White rice, white breads, and most bakery goods contain little or no fiber.   Brown whole-grain rice, buckwheat oats, and many fruits and vegetables are all good sources of fiber. These include: broccoli, Brussels sprouts, cabbage, cauliflower, beets, sweet potatoes, white potatoes (skin on), carrots, tomatoes, eggplant, squash, berries, fresh fruits, and dried fruits.   Cereals appear to be the richest source of fiber. Cereal fiber is found in whole grains and bran. Bran is the fiber-rich outer coat of cereal grain, which is largely removed in refining. In whole-grain cereals, the bran remains. In breakfast cereals, the largest amount of fiber is found in those with "bran" in their names. The fiber content is sometimes indicated on the label.   You may need to include additional fruits and vegetables each day.   In baking, for 1 cup white flour, you may use the following substitutions:   1 cup whole-wheat flour minus 2 tablespoons.   1/2 cup white flour plus 1/2 cup whole-wheat flour.   Polyps, Colon  A polyp is extra tissue that grows inside your body. Colon polyps grow in the large intestine. The large intestine, also called the colon, is  part of your digestive system. It is a long, hollow tube at the end of your digestive tract where your body makes and stores stool. Most polyps are not dangerous. They are benign. This means they are not cancerous. But over time, some types of polyps can turn into cancer. Polyps that are smaller than a pea are usually not harmful. But larger polyps could someday become or may already be cancerous. To be safe, doctors remove all polyps and test them.   WHO GETS POLYPS? Anyone can get polyps, but certain people are more likely than others. You may have a greater chance of getting polyps if:  You are over 50.   You have had polyps before.   Someone in your family has had polyps.   Someone in your family has had cancer of the large intestine.   Find out if someone in your family has had polyps. You may also be more likely to get polyps if you:   Eat a lot of fatty foods   Smoke  Drink alcohol   Do not exercise  Eat too much   TREATMENT  The caregiver will remove the polyp during sigmoidoscopy or colonoscopy.    PREVENTION There is not one sure way to prevent polyps. You might be able to lower your risk of getting them if you:  Eat more fruits and vegetables and less fatty food.   Do not smoke.   Avoid alcohol.   Exercise every day.   Lose weight if you are overweight.   Eating more calcium and folate can also lower your risk of getting polyps. Some foods that are rich in calcium are milk, cheese, and broccoli. Some foods that are rich in folate are chickpeas, kidney beans, and spinach.   Hemorrhoids Hemorrhoids are dilated (enlarged) veins around the rectum. Sometimes clots will form in the veins. This makes them swollen and painful. These are called thrombosed hemorrhoids. Causes of hemorrhoids include:  Constipation.   Straining to have a bowel movement.   HEAVY LIFTING HOME CARE INSTRUCTIONS  Eat a well balanced diet and drink 6 to 8 glasses of water every day to  avoid constipation. You may also use a bulk laxative.   Avoid straining to have bowel movements.   Keep anal area dry and clean.   Do not use a donut shaped pillow or sit on the toilet for long periods. This increases blood pooling and pain.   Move your bowels when your body has the urge; this will require less straining and will decrease pain and pressure.

## 2014-12-31 NOTE — OR Nursing (Signed)
Prior to beginning case heart rate 43, atropine ordered per Dr. Oneida Alar after administration heart rate increased to mid seventies.

## 2014-12-31 NOTE — H&P (Addendum)
Primary Care Physician:  MATTHEWS,MICHELLE A., MD Primary Gastroenterologist:  Dr. Oneida Alar  Pre-Procedure History & Physical: HPI:  Travis Santos is a 74 y.o. male here for  PERSONAL HISTORY OF POLYPS.  Past Medical History  Diagnosis Date  . Essential hypertension, benign   . GERD (gastroesophageal reflux disease)   . Atrial fibrillation   . Prostatic hypertrophy   . Arthritis   . Pituitary tumor     followed at [facility]    Past Surgical History  Procedure Laterality Date  . Nasal polyp surgery  2007  . Tumor removal  2006    Tumor removed from left clavical area, "softball size"  . Tumor removal  2014    on nose, radiation treatments  . Vein surgery Bilateral 10 years ago    "having trouble with phlebitis so dried up veins"  . Hydrocele excision Right 08/25/2013    Procedure: HYDROCELECTOMY ADULT;  Surgeon: Irine Seal, MD;  Location: [facility];  Service: Urology;  Laterality: Right;  . Transurethral resection of prostate N/A 08/25/2013    Procedure: TRANSURETHRAL RESECTION OF THE PROSTATE WITH GYRUS INSTRUMENTS;  Surgeon: Irine Seal, MD;  Location: [facility];  Service: Urology;  Laterality: N/A;  . Colonoscopy  02/07/04    Dr. Lindalou Hose: tubular adenoma    Prior to Admission medications   Medication Sig Start Date End Date Taking? Authorizing Provider  amLODipine (NORVASC) 5 MG tablet TAKE 1 TABLET BY MOUTH EVERY MORNING. 06/29/14  Yes Leana Gamer, MD  aspirin EC 81 MG tablet Take 81 mg by mouth daily.   Yes Historical Provider, MD  Digestive Enzymes CAPS Take 1 capsule by mouth daily.    Yes Historical Provider, MD  docusate sodium (COLACE) 100 MG capsule Take 100 mg by mouth daily.    Yes Historical Provider, MD  EPINEPHrine (EPI-PEN) 0.3 mg/0.3 mL SOAJ injection Inject 0.3 mLs (0.3 mg total) into the muscle once. 05/18/13  Yes Leana Gamer, MD  hydrochlorothiazide (HYDRODIURIL) 25 MG tablet Take 1 tablet (25 mg total) by mouth daily. 06/29/14  Yes Leana Gamer, MD  lisinopril (PRINIVIL,ZESTRIL) 20 MG tablet TAKE 1 TABLET BY MOUTH EVERY MORNING. 06/29/14  Yes Leana Gamer, MD  polyethylene glycol-electrolytes (TRILYTE) 420 G solution Take 4,000 mLs by mouth as directed. 12/08/14  Yes Danie Binder, MD  potassium chloride (K-DUR) 10 MEQ tablet TAKE (1) TABLET BY MOUTH ONCE DAILY. 06/29/14  Yes Leana Gamer, MD  simvastatin (ZOCOR) 20 MG tablet Take 1 tablet (20 mg total) by mouth at bedtime. 12/16/13  Yes Leana Gamer, MD  mometasone (NASONEX) 50 MCG/ACT nasal spray Place 4 sprays into the nose daily. States he only uses as needed    Historical Provider, MD    Allergies as of 12/08/2014 - Review Complete 12/08/2014  Allergen Reaction Noted  . Bee venom  08/13/2013    Family History  Problem Relation Age of Onset  . Hypertension Mother   . Stroke Mother   . Hypertension Father   . Hypertension Sister   . Stroke Brother   . Hypertension Daughter   . Diabetes Son   . Colon cancer Neg Hx     History   Social History  . Marital Status: Married    Spouse Name: N/A  . Number of Children: N/A  . Years of Education: N/A   Occupational History  . Not on file.   Social History Main Topics  . Smoking status: Never Smoker   .  Smokeless tobacco: Never Used  . Alcohol Use: No  . Drug Use: No  . Sexual Activity: Not on file   Other Topics Concern  . Not on file   Social History Narrative    Review of Systems: See HPI, otherwise negative ROS   Physical Exam: BP 144/104 mmHg  Pulse 68  Temp(Src) 97.3 F (36.3 C) (Oral)  Resp 18  Ht 6' (1.829 m)  Wt 200 lb (90.719 kg)  BMI 27.12 kg/m2  SpO2 97% General:   Alert,  pleasant and cooperative in NAD Head:  Normocephalic and atraumatic. Neck:  Supple; Lungs:  Clear throughout to auscultation.    Heart:  irRegular rhythm. Abdomen:  Soft, nontender and nondistended. Normal bowel sounds, without guarding, and without rebound.   Neurologic:  Alert and   oriented x4;  grossly normal neurologically.  Impression/Plan:   PERSONAL HISTORY OF POLYPS.  PLAN: 1. TCS TODAY

## 2015-01-03 ENCOUNTER — Encounter (HOSPITAL_COMMUNITY): Payer: Self-pay | Admitting: Gastroenterology

## 2015-01-06 ENCOUNTER — Ambulatory Visit (INDEPENDENT_AMBULATORY_CARE_PROVIDER_SITE_OTHER): Payer: Medicare Other | Admitting: Internal Medicine

## 2015-01-06 ENCOUNTER — Telehealth: Payer: Self-pay | Admitting: Gastroenterology

## 2015-01-06 VITALS — BP 124/84 | HR 50 | Temp 97.7°F | Resp 16 | Ht 72.0 in | Wt 194.0 lb

## 2015-01-06 DIAGNOSIS — I10 Essential (primary) hypertension: Secondary | ICD-10-CM | POA: Diagnosis not present

## 2015-01-06 DIAGNOSIS — Z9189 Other specified personal risk factors, not elsewhere classified: Secondary | ICD-10-CM

## 2015-01-06 DIAGNOSIS — M25512 Pain in left shoulder: Secondary | ICD-10-CM

## 2015-01-06 DIAGNOSIS — E785 Hyperlipidemia, unspecified: Secondary | ICD-10-CM

## 2015-01-06 DIAGNOSIS — M4802 Spinal stenosis, cervical region: Secondary | ICD-10-CM | POA: Diagnosis not present

## 2015-01-06 DIAGNOSIS — Z87898 Personal history of other specified conditions: Secondary | ICD-10-CM

## 2015-01-06 DIAGNOSIS — M25619 Stiffness of unspecified shoulder, not elsewhere classified: Secondary | ICD-10-CM

## 2015-01-06 DIAGNOSIS — M7580 Other shoulder lesions, unspecified shoulder: Secondary | ICD-10-CM | POA: Diagnosis not present

## 2015-01-06 NOTE — Patient Instructions (Signed)
Calorie Counting for Weight Loss Calories are energy you get from the things you eat and drink. Your body uses this energy to keep you going throughout the day. The number of calories you eat affects your weight. When you eat more calories than your body needs, your body stores the extra calories as fat. When you eat fewer calories than your body needs, your body burns fat to get the energy it needs. Calorie counting means keeping track of how many calories you eat and drink each day. If you make sure to eat fewer calories than your body needs, you should lose weight. In order for calorie counting to work, you will need to eat the number of calories that are right for you in a day to lose a healthy amount of weight per week. A healthy amount of weight to lose per week is usually 1-2 lb (0.5-0.9 kg). A dietitian can determine how many calories you need in a day and give you suggestions on how to reach your calorie goal.  WHAT IS MY MY PLAN? My goal is to have __________ calories per day.  If I have this many calories per day, I should lose around __________ pounds per week. WHAT DO I NEED TO KNOW ABOUT CALORIE COUNTING? In order to meet your daily calorie goal, you will need to:  Find out how many calories are in each food you would like to eat. Try to do this before you eat.  Decide how much of the food you can eat.  Write down what you ate and how many calories it had. Doing this is called keeping a food log. WHERE DO I FIND CALORIE INFORMATION? The number of calories in a food can be found on a Nutrition Facts label. Note that all the information on a label is based on a specific serving of the food. If a food does not have a Nutrition Facts label, try to look up the calories online or ask your dietitian for help. HOW DO I DECIDE HOW MUCH TO EAT? To decide how much of the food you can eat, you will need to consider both the number of calories in one serving and the size of one serving. This  information can be found on the Nutrition Facts label. If a food does not have a Nutrition Facts label, look up the information online or ask your dietitian for help. Remember that calories are listed per serving. If you choose to have more than one serving of a food, you will have to multiply the calories per serving by the amount of servings you plan to eat. For example, the label on a package of bread might say that a serving size is 1 slice and that there are 90 calories in a serving. If you eat 1 slice, you will have eaten 90 calories. If you eat 2 slices, you will have eaten 180 calories. HOW DO I KEEP A FOOD LOG? After each meal, record the following information in your food log:  What you ate.  How much of it you ate.  How many calories it had.  Then, add up your calories. Keep your food log near you, such as in a small notebook in your pocket. Another option is to use a mobile app or website. Some programs will calculate calories for you and show you how many calories you have left each time you add an item to the log. WHAT ARE SOME CALORIE COUNTING TIPS?  Use your calories on foods   and drinks that will fill you up and not leave you hungry. Some examples of this include foods like nuts and nut butters, vegetables, lean proteins, and high-fiber foods (more than 5 g fiber per serving).  Eat nutritious foods and avoid empty calories. Empty calories are calories you get from foods or beverages that do not have many nutrients, such as candy and soda. It is better to have a nutritious high-calorie food (such as an avocado) than a food with few nutrients (such as a bag of chips).  Know how many calories are in the foods you eat most often. This way, you do not have to look up how many calories they have each time you eat them.  Look out for foods that may seem like low-calorie foods but are really high-calorie foods, such as baked goods, soda, and fat-free candy.  Pay attention to calories  in drinks. Drinks such as sodas, specialty coffee drinks, alcohol, and juices have a lot of calories yet do not fill you up. Choose low-calorie drinks like water and diet drinks.  Focus your calorie counting efforts on higher calorie items. Logging the calories in a garden salad that contains only vegetables is less important than calculating the calories in a milk shake.  Find a way of tracking calories that works for you. Get creative. Most people who are successful find ways to keep track of how much they eat in a day, even if they do not count every calorie. WHAT ARE SOME PORTION CONTROL TIPS?  Know how many calories are in a serving. This will help you know how many servings of a certain food you can have.  Use a measuring cup to measure serving sizes. This is helpful when you start out. With time, you will be able to estimate serving sizes for some foods.  Take some time to put servings of different foods on your favorite plates, bowls, and cups so you know what a serving looks like.  Try not to eat straight from a bag or box. Doing this can lead to overeating. Put the amount you would like to eat in a cup or on a plate to make sure you are eating the right portion.  Use smaller plates, glasses, and bowls to prevent overeating. This is a quick and easy way to practice portion control. If your plate is smaller, less food can fit on it.  Try not to multitask while eating, such as watching TV or using your computer. If it is time to eat, sit down at a table and enjoy your food. Doing this will help you to start recognizing when you are full. It will also make you more aware of what and how much you are eating. HOW CAN I CALORIE COUNT WHEN EATING OUT?  Ask for smaller portion sizes or child-sized portions.  Consider sharing an entree and sides instead of getting your own entree.  If you get your own entree, eat only half. Ask for a box at the beginning of your meal and put the rest of your  entree in it so you are not tempted to eat it.  Look for the calories on the menu. If calories are listed, choose the lower calorie options.  Choose dishes that include vegetables, fruits, whole grains, low-fat dairy products, and lean protein. Focusing on smart food choices from each of the 5 food groups can help you stay on track at restaurants.  Choose items that are boiled, broiled, grilled, or steamed.  Choose   water, milk, unsweetened iced tea, or other drinks without added sugars. If you want an alcoholic beverage, choose a lower calorie option. For example, a regular margarita can have up to 700 calories and a glass of wine has around 150.  Stay away from items that are buttered, battered, fried, or served with cream sauce. Items labeled "crispy" are usually fried, unless stated otherwise.  Ask for dressings, sauces, and syrups on the side. These are usually very high in calories, so do not eat much of them.  Watch out for salads. Many people think salads are a healthy option, but this is often not the case. Many salads come with bacon, fried chicken, lots of cheese, fried chips, and dressing. All of these items have a lot of calories. If you want a salad, choose a garden salad and ask for grilled meats or steak. Ask for the dressing on the side, or ask for olive oil and vinegar or lemon to use as dressing.  Estimate how many servings of a food you are given. For example, a serving of cooked rice is  cup or about the size of half a tennis ball or one cupcake wrapper. Knowing serving sizes will help you be aware of how much food you are eating at restaurants. The list below tells you how big or small some common portion sizes are based on everyday objects.  1 oz--4 stacked dice.  3 oz--1 deck of cards.  1 tsp--1 dice.  1 Tbsp-- a Ping-Pong ball.  2 Tbsp--1 Ping-Pong ball.   cup--1 tennis ball or 1 cupcake wrapper.  1 cup--1 baseball. Document Released: 08/13/2005 Document  Revised: 12/28/2013 Document Reviewed: 06/18/2013 [facility] Patient Information 2015 [place], [place]. This information is not intended to replace advice given to you by your health care provider. Make sure you discuss any questions you have with your health care provider. Smoking Cessation Quitting smoking is important to your health and has many advantages. However, it is not always easy to quit since nicotine is a very addictive drug. Oftentimes, people try 3 times or more before being able to quit. This document explains the best ways for you to prepare to quit smoking. Quitting takes hard work and a lot of effort, but you can do it. ADVANTAGES OF QUITTING SMOKING  You will live longer, feel better, and live better.  Your body will feel the impact of quitting smoking almost immediately.  Within 20 minutes, blood pressure decreases. Your pulse returns to its normal level.  After 8 hours, carbon monoxide levels in the blood return to normal. Your oxygen level increases.  After 24 hours, the chance of having a heart attack starts to decrease. Your breath, hair, and body stop smelling like smoke.  After 48 hours, damaged nerve endings begin to recover. Your sense of taste and smell improve.  After 72 hours, the body is virtually free of nicotine. Your bronchial tubes relax and breathing becomes easier.  After 2 to 12 weeks, lungs can hold more air. Exercise becomes easier and circulation improves.  The risk of having a heart attack, stroke, cancer, or lung disease is greatly reduced.  After 1 year, the risk of coronary heart disease is cut in half.  After 5 years, the risk of stroke falls to the same as a nonsmoker.  After 10 years, the risk of lung cancer is cut in half and the risk of other cancers decreases significantly.  After 15 years, the risk of coronary heart disease drops, usually to  the level of a nonsmoker.  If you are pregnant, quitting smoking will improve your chances of  having a healthy baby.  The people you live with, especially any children, will be healthier.  You will have extra money to spend on things other than cigarettes. QUESTIONS TO THINK ABOUT BEFORE ATTEMPTING TO QUIT You may want to talk about your answers with your health care provider.  Why do you want to quit?  If you tried to quit in the past, what helped and what did not?  What will be the most difficult situations for you after you quit? How will you plan to handle them?  Who can help you through the tough times? Your family? Friends? A health care provider?  What pleasures do you get from smoking? What ways can you still get pleasure if you quit? Here are some questions to ask your health care provider:  How can you help me to be successful at quitting?  What medicine do you think would be best for me and how should I take it?  What should I do if I need more help?  What is smoking withdrawal like? How can I get information on withdrawal? GET READY  Set a quit date.  Change your environment by getting rid of all cigarettes, ashtrays, matches, and lighters in your home, car, or work. Do not let people smoke in your home.  Review your past attempts to quit. Think about what worked and what did not. GET SUPPORT AND ENCOURAGEMENT You have a better chance of being successful if you have help. You can get support in many ways.  Tell your family, friends, and coworkers that you are going to quit and need their support. Ask them not to smoke around you.  Get individual, group, or telephone counseling and support. Programs are available at [employer] and health centers. Call your local health department for information about programs in your area.  Spiritual beliefs and practices may help some smokers quit.  Download a "quit meter" on your computer to keep track of quit statistics, such as how long you have gone without smoking, cigarettes not smoked, and money saved.  Get  a self-help book about quitting smoking and staying off tobacco. [place] yourself from urges to smoke. Talk to someone, go for a walk, or occupy your time with a task.  Change your normal routine. Take a different route to work. Drink tea instead of coffee. Eat breakfast in a different place.  Reduce your stress. Take a hot bath, exercise, or read a book.  Plan something enjoyable to do every day. Reward yourself for not smoking.  Explore interactive web-based programs that specialize in helping you quit. GET MEDICINE AND USE IT CORRECTLY Medicines can help you stop smoking and decrease the urge to smoke. Combining medicine with the above behavioral methods and support can greatly increase your chances of successfully quitting smoking.  Nicotine replacement therapy helps deliver nicotine to your body without the negative effects and risks of smoking. Nicotine replacement therapy includes nicotine gum, lozenges, inhalers, nasal sprays, and skin patches. Some may be available over-the-counter and others require a prescription.  Antidepressant medicine helps people abstain from smoking, but how this works is unknown. This medicine is available by prescription.  Nicotinic receptor partial agonist medicine simulates the effect of nicotine in your brain. This medicine is available by prescription. Ask your health care provider for advice about which medicines to use and how  to use them based on your health history. Your health care provider will tell you what side effects to look out for if you choose to be on a medicine or therapy. Carefully read the information on the package. Do not use any other product containing nicotine while using a nicotine replacement product.  RELAPSE OR DIFFICULT SITUATIONS Most relapses occur within the first 3 months after quitting. Do not be discouraged if you start smoking again. Remember, most people try several times before finally  quitting. You may have symptoms of withdrawal because your body is used to nicotine. You may crave cigarettes, be irritable, feel very hungry, cough often, get headaches, or have difficulty concentrating. The withdrawal symptoms are only temporary. They are strongest when you first quit, but they will go away within 10-14 days. To reduce the chances of relapse, try to:  Avoid drinking alcohol. Drinking lowers your chances of successfully quitting.  Reduce the amount of caffeine you consume. Once you quit smoking, the amount of caffeine in your body increases and can give you symptoms, such as a rapid heartbeat, sweating, and anxiety.  Avoid smokers because they can make you want to smoke.  Do not let weight gain distract you. Many smokers will gain weight when they quit, usually less than 10 pounds. Eat a healthy diet and stay active. You can always lose the weight gained after you quit.  Find ways to improve your mood other than smoking. FOR MORE INFORMATION  www.smokefree.gov  Document Released: 08/07/2001 Document Revised: 12/28/2013 Document Reviewed: 11/22/2011 [facility] Patient Information 2015 [place], [place]. This information is not intended to replace advice given to you by your health care provider. Make sure you discuss any questions you have with your health care provider. Hypertension Hypertension, commonly called high blood pressure, is when the force of blood pumping through your arteries is too strong. Your arteries are the blood vessels that carry blood from your heart throughout your body. A blood pressure reading consists of a higher number over a lower number, such as 110/72. The higher number (systolic) is the pressure inside your arteries when your heart pumps. The lower number (diastolic) is the pressure inside your arteries when your heart relaxes. Ideally you want your blood pressure below 120/80. Hypertension forces your heart to work harder to pump blood. Your arteries may  become narrow or stiff. Having hypertension puts you at risk for heart disease, stroke, and other problems.  RISK FACTORS Some risk factors for high blood pressure are controllable. Others are not.  Risk factors you cannot control include:   Race. You may be at higher risk if you are African American.  Age. Risk increases with age.  Gender. Men are at higher risk than women before age 45 years. After age 71, women are at higher risk than men. Risk factors you can control include:  Not getting enough exercise or physical activity.  Being overweight.  Getting too much fat, sugar, calories, or salt in your diet.  Drinking too much alcohol. SIGNS AND SYMPTOMS Hypertension does not usually cause signs or symptoms. Extremely high blood pressure (hypertensive crisis) may cause headache, anxiety, shortness of breath, and nosebleed. DIAGNOSIS  To check if you have hypertension, your health care provider will measure your blood pressure while you are seated, with your arm held at the level of your heart. It should be measured at least twice using the same arm. Certain conditions can cause a difference in blood pressure between your right and left arms. A  blood pressure reading that is higher than normal on one occasion does not mean that you need treatment. If one blood pressure reading is high, ask your health care provider about having it checked again. TREATMENT  Treating high blood pressure includes making lifestyle changes and possibly taking medicine. Living a healthy lifestyle can help lower high blood pressure. You may need to change some of your habits. Lifestyle changes may include:  Following the DASH diet. This diet is high in fruits, vegetables, and whole grains. It is low in salt, red meat, and added sugars.  Getting at least 2 hours of brisk physical activity every week.  Losing weight if necessary.  Not smoking.  Limiting alcoholic beverages.  Learning ways to reduce  stress. If lifestyle changes are not enough to get your blood pressure under control, your health care provider may prescribe medicine. You may need to take more than one. Work closely with your health care provider to understand the risks and benefits. HOME CARE INSTRUCTIONS  Have your blood pressure rechecked as directed by your health care provider.   Take medicines only as directed by your health care provider. Follow the directions carefully. Blood pressure medicines must be taken as prescribed. The medicine does not work as well when you skip doses. Skipping doses also puts you at risk for problems.   Do not smoke.   Monitor your blood pressure at home as directed by your health care provider. SEEK MEDICAL CARE IF:   You think you are having a reaction to medicines taken.  You have recurrent headaches or feel dizzy.  You have swelling in your ankles.  You have trouble with your vision. SEEK IMMEDIATE MEDICAL CARE IF:  You develop a severe headache or confusion.  You have unusual weakness, numbness, or feel faint.  You have severe chest or abdominal pain.  You vomit repeatedly.  You have trouble breathing. MAKE SURE YOU:   Understand these instructions.  Will watch your condition.  Will get help right away if you are not doing well or get worse. Document Released: 08/13/2005 Document Revised: 12/28/2013 Document Reviewed: 06/05/2013 [facility] Patient Information 2015 [place], [place]. This information is not intended to replace advice given to you by your health care provider. Make sure you discuss any questions you have with your health care provider.

## 2015-01-06 NOTE — Telephone Encounter (Signed)
Please call pt. HE had FOUR simple adenomas removed.  FOLLOW A HIGH FIBER DIET.  NEXT TCS IN 3 YEARS.

## 2015-01-06 NOTE — Telephone Encounter (Signed)
Reminder in epic °

## 2015-01-06 NOTE — Telephone Encounter (Signed)
Pt is aware.  

## 2015-01-07 ENCOUNTER — Encounter: Payer: Self-pay | Admitting: Internal Medicine

## 2015-01-07 LAB — BASIC METABOLIC PANEL WITH GFR
BUN: 17 mg/dL (ref 6–23)
CO2: 27 meq/L (ref 19–32)
Calcium: 9.3 mg/dL (ref 8.4–10.5)
Chloride: 103 mEq/L (ref 96–112)
Creat: 1.29 mg/dL (ref 0.50–1.35)
GFR, EST NON AFRICAN AMERICAN: 54 mL/min — AB
GFR, Est African American: 62 mL/min
GLUCOSE: 86 mg/dL (ref 70–99)
Potassium: 3.5 mEq/L (ref 3.5–5.3)
Sodium: 142 mEq/L (ref 135–145)

## 2015-01-07 NOTE — Progress Notes (Signed)
Patient ID: SHIMSHON NARULA, male   DOB: [birth year], 74 y.o.   MRN: [id number]  HPI 74 y.o. male  presents for 3 month follow up with hypertension, hyperlipidemia, prediabetes and vitamin D. His blood pressure has been controlled at home, today their BP is BP: 124/84 mmHg He does not workout. He denies chest pain, shortness of breath, dizziness.  He is on cholesterol medication and denies myalgias. His cholesterol is at goal. The cholesterol last visit was:   Lab Results  Component Value Date   CHOL 135 06/29/2014   HDL 47 06/29/2014   LDLCALC 69 06/29/2014   TRIG 96 06/29/2014   CHOLHDL 2.9 06/29/2014   He has been working on diet and exercise for prediabetes, and denies hyperglycemia, hypoglycemia , increased appetite, nausea, polydipsia, polyuria, visual disturbances and weight loss. Last A1C in the office was: No results found for: HGBA1C Patient is  Not on Vitamin D supplement.   No results found for: VD25OH     Current Medications:  Current Outpatient Prescriptions on File Prior to Visit  Medication Sig Dispense Refill  . amLODipine (NORVASC) 5 MG tablet TAKE 1 TABLET BY MOUTH EVERY MORNING. 90 tablet 3  . Digestive Enzymes CAPS Take 1 capsule by mouth daily.     Marland Kitchen docusate sodium (COLACE) 100 MG capsule Take 100 mg by mouth daily.     Marland Kitchen EPINEPHrine (EPI-PEN) 0.3 mg/0.3 mL SOAJ injection Inject 0.3 mLs (0.3 mg total) into the muscle once. 1 Device 1  . hydrochlorothiazide (HYDRODIURIL) 25 MG tablet Take 1 tablet (25 mg total) by mouth daily. 90 tablet 3  . lisinopril (PRINIVIL,ZESTRIL) 20 MG tablet TAKE 1 TABLET BY MOUTH EVERY MORNING. 90 tablet 3  . mometasone (NASONEX) 50 MCG/ACT nasal spray Place 4 sprays into the nose daily. States he only uses as needed    . potassium chloride (K-DUR) 10 MEQ tablet TAKE (1) TABLET BY MOUTH ONCE DAILY. 90 tablet 3  . simvastatin (ZOCOR) 20 MG tablet Take 1 tablet (20 mg total) by mouth at bedtime. 90 tablet 3   No current  facility-administered medications on file prior to visit.   Medical History:  Past Medical History  Diagnosis Date  . Essential hypertension, benign   . GERD (gastroesophageal reflux disease)   . Atrial fibrillation   . Prostatic hypertrophy   . Arthritis   . Pituitary tumor     followed at [facility]:  Allergies  Allergen Reactions  . Bee Venom     swelling     Review of Systems:  Review of Systems  Constitutional: Negative.   Eyes: Negative.   Respiratory: Negative.   Cardiovascular: Negative.   Gastrointestinal: Negative.   Genitourinary: Negative.   Musculoskeletal: Positive for neck pain.  Skin: Negative.   Neurological: Positive for tingling (Pt c/o occasional tingling down the left UE with selected movements).  Endo/Heme/Allergies: Negative.   Psychiatric/Behavioral: Negative.      Family history- Review and unchanged Social history- Review and unchanged Physical Exam: BP 124/84 mmHg  Pulse 50  Temp(Src) 97.7 F (36.5 C) (Oral)  Resp 16  Ht 6' (1.829 m)  Wt 194 lb (87.998 kg)  BMI 26.31 kg/m2 Wt Readings from Last 3 Encounters:  01/06/15 194 lb (87.998 kg)  12/31/14 200 lb (90.719 kg)  12/08/14 200 lb (90.719 kg)   General Appearance: Well nourished, in no apparent distress. Eyes: PERRLA, EOMs, conjunctiva no swelling or erythema Sinuses: No Frontal/maxillary tenderness ENT/Mouth: Ext aud canals clear, TMs  without erythema, bulging. No erythema, swelling, or exudate on post pharynx.  Tonsils not swollen or erythematous. Hearing normal.  Neck: Supple, thyroid normal.  Respiratory: Respiratory effort normal, BS equal bilaterally without rales, rhonchi, wheezing or stridor.  Cardio: RRR with no MRGs. Brisk peripheral pulses without edema.  Abdomen: Soft, + BS.  Non tender, no guarding, rebound, hernias, masses. Lymphatics: Non tender without lymphadenopathy.  Musculoskeletal: Full ROM, 5/5 strength, normal gait.  Skin: Warm, dry without  rashes, lesions, ecchymosis.  Neuro: Cranial nerves intact. Normal muscle tone, no cerebellar symptoms. Sensation intact.  Psych: Awake and oriented X 3, normal affect, Insight and Judgment appropriate.   Assessment and Plan:  1. Spinal stenosis in cervical region -  MRI showed spinal stenosis. Pt having tingling down left arm. In light of symptoms will refer to Dr. Salomon Fick at [facility] who patient has seen in the past. - Ambulatory referral to Neurosurgery  2. Pain in joint, shoulder region, left/Decreased range of motion (ROM) of shoulder - Likely of arthritic etiology. Will obtain left pain shoulder. - DG Shoulder Left; Future  3. Essential hypertension, benign - BP well controlled - BASIC METABOLIC PANEL WITH GFR - Continue medication, monitor blood pressure at home. Continue DASH diet.  Reminder to go to the ER if any CP, SOB, nausea, dizziness, severe HA, changes vision/speech, left arm numbness and tingling, and jaw pain.  4. Cholesterol - Continue diet and exercise. Check cholesterol.    Continue diet and meds as discussed. Further disposition pending results of labs.  Demorio Seeley A., MD 12:21 PM [facility]

## 2015-02-11 DIAGNOSIS — M4312 Spondylolisthesis, cervical region: Secondary | ICD-10-CM | POA: Diagnosis not present

## 2015-02-11 DIAGNOSIS — M4802 Spinal stenosis, cervical region: Secondary | ICD-10-CM | POA: Diagnosis not present

## 2015-02-11 DIAGNOSIS — M47812 Spondylosis without myelopathy or radiculopathy, cervical region: Secondary | ICD-10-CM | POA: Diagnosis not present

## 2015-02-17 DIAGNOSIS — M542 Cervicalgia: Secondary | ICD-10-CM | POA: Diagnosis not present

## 2015-02-17 DIAGNOSIS — M7542 Impingement syndrome of left shoulder: Secondary | ICD-10-CM | POA: Diagnosis not present

## 2015-02-17 DIAGNOSIS — M6281 Muscle weakness (generalized): Secondary | ICD-10-CM | POA: Diagnosis not present

## 2015-02-17 DIAGNOSIS — M47812 Spondylosis without myelopathy or radiculopathy, cervical region: Secondary | ICD-10-CM | POA: Diagnosis not present

## 2015-02-22 DIAGNOSIS — M6281 Muscle weakness (generalized): Secondary | ICD-10-CM | POA: Diagnosis not present

## 2015-02-22 DIAGNOSIS — M542 Cervicalgia: Secondary | ICD-10-CM | POA: Diagnosis not present

## 2015-02-22 DIAGNOSIS — M7542 Impingement syndrome of left shoulder: Secondary | ICD-10-CM | POA: Diagnosis not present

## 2015-02-22 DIAGNOSIS — M47812 Spondylosis without myelopathy or radiculopathy, cervical region: Secondary | ICD-10-CM | POA: Diagnosis not present

## 2015-02-23 DIAGNOSIS — M542 Cervicalgia: Secondary | ICD-10-CM | POA: Diagnosis not present

## 2015-02-23 DIAGNOSIS — M47812 Spondylosis without myelopathy or radiculopathy, cervical region: Secondary | ICD-10-CM | POA: Diagnosis not present

## 2015-02-23 DIAGNOSIS — M7542 Impingement syndrome of left shoulder: Secondary | ICD-10-CM | POA: Diagnosis not present

## 2015-02-23 DIAGNOSIS — M6281 Muscle weakness (generalized): Secondary | ICD-10-CM | POA: Diagnosis not present

## 2015-02-25 DIAGNOSIS — M47812 Spondylosis without myelopathy or radiculopathy, cervical region: Secondary | ICD-10-CM | POA: Diagnosis not present

## 2015-02-25 DIAGNOSIS — M542 Cervicalgia: Secondary | ICD-10-CM | POA: Diagnosis not present

## 2015-02-25 DIAGNOSIS — M7542 Impingement syndrome of left shoulder: Secondary | ICD-10-CM | POA: Diagnosis not present

## 2015-02-25 DIAGNOSIS — M6281 Muscle weakness (generalized): Secondary | ICD-10-CM | POA: Diagnosis not present

## 2015-03-01 DIAGNOSIS — M542 Cervicalgia: Secondary | ICD-10-CM | POA: Diagnosis not present

## 2015-03-01 DIAGNOSIS — M47812 Spondylosis without myelopathy or radiculopathy, cervical region: Secondary | ICD-10-CM | POA: Diagnosis not present

## 2015-03-01 DIAGNOSIS — M6281 Muscle weakness (generalized): Secondary | ICD-10-CM | POA: Diagnosis not present

## 2015-03-01 DIAGNOSIS — M7542 Impingement syndrome of left shoulder: Secondary | ICD-10-CM | POA: Diagnosis not present

## 2015-03-02 DIAGNOSIS — M47812 Spondylosis without myelopathy or radiculopathy, cervical region: Secondary | ICD-10-CM | POA: Diagnosis not present

## 2015-03-02 DIAGNOSIS — M7542 Impingement syndrome of left shoulder: Secondary | ICD-10-CM | POA: Diagnosis not present

## 2015-03-02 DIAGNOSIS — M542 Cervicalgia: Secondary | ICD-10-CM | POA: Diagnosis not present

## 2015-03-02 DIAGNOSIS — M6281 Muscle weakness (generalized): Secondary | ICD-10-CM | POA: Diagnosis not present

## 2015-03-04 DIAGNOSIS — M542 Cervicalgia: Secondary | ICD-10-CM | POA: Diagnosis not present

## 2015-03-04 DIAGNOSIS — M6281 Muscle weakness (generalized): Secondary | ICD-10-CM | POA: Diagnosis not present

## 2015-03-04 DIAGNOSIS — M47812 Spondylosis without myelopathy or radiculopathy, cervical region: Secondary | ICD-10-CM | POA: Diagnosis not present

## 2015-03-04 DIAGNOSIS — M7542 Impingement syndrome of left shoulder: Secondary | ICD-10-CM | POA: Diagnosis not present

## 2015-03-08 DIAGNOSIS — M47812 Spondylosis without myelopathy or radiculopathy, cervical region: Secondary | ICD-10-CM | POA: Diagnosis not present

## 2015-03-08 DIAGNOSIS — M6281 Muscle weakness (generalized): Secondary | ICD-10-CM | POA: Diagnosis not present

## 2015-03-08 DIAGNOSIS — M7542 Impingement syndrome of left shoulder: Secondary | ICD-10-CM | POA: Diagnosis not present

## 2015-03-08 DIAGNOSIS — M542 Cervicalgia: Secondary | ICD-10-CM | POA: Diagnosis not present

## 2015-03-09 DIAGNOSIS — M6281 Muscle weakness (generalized): Secondary | ICD-10-CM | POA: Diagnosis not present

## 2015-03-09 DIAGNOSIS — M542 Cervicalgia: Secondary | ICD-10-CM | POA: Diagnosis not present

## 2015-03-09 DIAGNOSIS — M47812 Spondylosis without myelopathy or radiculopathy, cervical region: Secondary | ICD-10-CM | POA: Diagnosis not present

## 2015-03-09 DIAGNOSIS — M7542 Impingement syndrome of left shoulder: Secondary | ICD-10-CM | POA: Diagnosis not present

## 2015-03-10 DIAGNOSIS — Z923 Personal history of irradiation: Secondary | ICD-10-CM | POA: Diagnosis not present

## 2015-03-10 DIAGNOSIS — Z08 Encounter for follow-up examination after completed treatment for malignant neoplasm: Secondary | ICD-10-CM | POA: Diagnosis not present

## 2015-03-10 DIAGNOSIS — Z09 Encounter for follow-up examination after completed treatment for conditions other than malignant neoplasm: Secondary | ICD-10-CM | POA: Diagnosis not present

## 2015-03-10 DIAGNOSIS — D352 Benign neoplasm of pituitary gland: Secondary | ICD-10-CM | POA: Diagnosis not present

## 2015-03-10 DIAGNOSIS — Z86018 Personal history of other benign neoplasm: Secondary | ICD-10-CM | POA: Diagnosis not present

## 2015-03-10 DIAGNOSIS — Z9889 Other specified postprocedural states: Secondary | ICD-10-CM | POA: Diagnosis not present

## 2015-03-11 DIAGNOSIS — M47812 Spondylosis without myelopathy or radiculopathy, cervical region: Secondary | ICD-10-CM | POA: Diagnosis not present

## 2015-03-11 DIAGNOSIS — M6281 Muscle weakness (generalized): Secondary | ICD-10-CM | POA: Diagnosis not present

## 2015-03-11 DIAGNOSIS — M7542 Impingement syndrome of left shoulder: Secondary | ICD-10-CM | POA: Diagnosis not present

## 2015-03-11 DIAGNOSIS — M542 Cervicalgia: Secondary | ICD-10-CM | POA: Diagnosis not present

## 2015-03-15 DIAGNOSIS — M47812 Spondylosis without myelopathy or radiculopathy, cervical region: Secondary | ICD-10-CM | POA: Diagnosis not present

## 2015-03-15 DIAGNOSIS — M7542 Impingement syndrome of left shoulder: Secondary | ICD-10-CM | POA: Diagnosis not present

## 2015-03-15 DIAGNOSIS — M542 Cervicalgia: Secondary | ICD-10-CM | POA: Diagnosis not present

## 2015-03-15 DIAGNOSIS — M6281 Muscle weakness (generalized): Secondary | ICD-10-CM | POA: Diagnosis not present

## 2015-03-17 DIAGNOSIS — M47812 Spondylosis without myelopathy or radiculopathy, cervical region: Secondary | ICD-10-CM | POA: Diagnosis not present

## 2015-03-17 DIAGNOSIS — M7542 Impingement syndrome of left shoulder: Secondary | ICD-10-CM | POA: Diagnosis not present

## 2015-03-17 DIAGNOSIS — M6281 Muscle weakness (generalized): Secondary | ICD-10-CM | POA: Diagnosis not present

## 2015-03-17 DIAGNOSIS — M542 Cervicalgia: Secondary | ICD-10-CM | POA: Diagnosis not present

## 2015-03-18 DIAGNOSIS — M542 Cervicalgia: Secondary | ICD-10-CM | POA: Diagnosis not present

## 2015-03-18 DIAGNOSIS — M7542 Impingement syndrome of left shoulder: Secondary | ICD-10-CM | POA: Diagnosis not present

## 2015-03-18 DIAGNOSIS — M6281 Muscle weakness (generalized): Secondary | ICD-10-CM | POA: Diagnosis not present

## 2015-03-18 DIAGNOSIS — M47812 Spondylosis without myelopathy or radiculopathy, cervical region: Secondary | ICD-10-CM | POA: Diagnosis not present

## 2015-03-21 DIAGNOSIS — M47812 Spondylosis without myelopathy or radiculopathy, cervical region: Secondary | ICD-10-CM | POA: Diagnosis not present

## 2015-03-21 DIAGNOSIS — M6281 Muscle weakness (generalized): Secondary | ICD-10-CM | POA: Diagnosis not present

## 2015-03-21 DIAGNOSIS — M542 Cervicalgia: Secondary | ICD-10-CM | POA: Diagnosis not present

## 2015-03-21 DIAGNOSIS — M7542 Impingement syndrome of left shoulder: Secondary | ICD-10-CM | POA: Diagnosis not present

## 2015-03-23 DIAGNOSIS — M7542 Impingement syndrome of left shoulder: Secondary | ICD-10-CM | POA: Diagnosis not present

## 2015-03-23 DIAGNOSIS — M6281 Muscle weakness (generalized): Secondary | ICD-10-CM | POA: Diagnosis not present

## 2015-03-23 DIAGNOSIS — M47812 Spondylosis without myelopathy or radiculopathy, cervical region: Secondary | ICD-10-CM | POA: Diagnosis not present

## 2015-03-23 DIAGNOSIS — M542 Cervicalgia: Secondary | ICD-10-CM | POA: Diagnosis not present

## 2015-03-25 DIAGNOSIS — M7542 Impingement syndrome of left shoulder: Secondary | ICD-10-CM | POA: Diagnosis not present

## 2015-03-25 DIAGNOSIS — M542 Cervicalgia: Secondary | ICD-10-CM | POA: Diagnosis not present

## 2015-03-25 DIAGNOSIS — M47812 Spondylosis without myelopathy or radiculopathy, cervical region: Secondary | ICD-10-CM | POA: Diagnosis not present

## 2015-03-25 DIAGNOSIS — M6281 Muscle weakness (generalized): Secondary | ICD-10-CM | POA: Diagnosis not present

## 2015-03-28 DIAGNOSIS — M542 Cervicalgia: Secondary | ICD-10-CM | POA: Diagnosis not present

## 2015-03-28 DIAGNOSIS — M7542 Impingement syndrome of left shoulder: Secondary | ICD-10-CM | POA: Diagnosis not present

## 2015-03-28 DIAGNOSIS — M6281 Muscle weakness (generalized): Secondary | ICD-10-CM | POA: Diagnosis not present

## 2015-03-28 DIAGNOSIS — M47812 Spondylosis without myelopathy or radiculopathy, cervical region: Secondary | ICD-10-CM | POA: Diagnosis not present

## 2015-03-31 DIAGNOSIS — M6281 Muscle weakness (generalized): Secondary | ICD-10-CM | POA: Diagnosis not present

## 2015-03-31 DIAGNOSIS — M542 Cervicalgia: Secondary | ICD-10-CM | POA: Diagnosis not present

## 2015-03-31 DIAGNOSIS — M47812 Spondylosis without myelopathy or radiculopathy, cervical region: Secondary | ICD-10-CM | POA: Diagnosis not present

## 2015-03-31 DIAGNOSIS — M7542 Impingement syndrome of left shoulder: Secondary | ICD-10-CM | POA: Diagnosis not present

## 2015-05-13 ENCOUNTER — Other Ambulatory Visit: Payer: Self-pay | Admitting: Internal Medicine

## 2015-08-30 DIAGNOSIS — I482 Chronic atrial fibrillation: Secondary | ICD-10-CM | POA: Diagnosis not present

## 2015-08-30 DIAGNOSIS — E782 Mixed hyperlipidemia: Secondary | ICD-10-CM | POA: Diagnosis not present

## 2015-08-30 DIAGNOSIS — M1991 Primary osteoarthritis, unspecified site: Secondary | ICD-10-CM | POA: Diagnosis not present

## 2015-08-30 DIAGNOSIS — I10 Essential (primary) hypertension: Secondary | ICD-10-CM | POA: Diagnosis not present

## 2015-08-30 DIAGNOSIS — M4802 Spinal stenosis, cervical region: Secondary | ICD-10-CM | POA: Diagnosis not present

## 2015-08-30 DIAGNOSIS — K635 Polyp of colon: Secondary | ICD-10-CM | POA: Diagnosis not present

## 2015-11-25 DIAGNOSIS — I482 Chronic atrial fibrillation: Secondary | ICD-10-CM | POA: Diagnosis not present

## 2015-11-25 DIAGNOSIS — I10 Essential (primary) hypertension: Secondary | ICD-10-CM | POA: Diagnosis not present

## 2015-11-25 DIAGNOSIS — K635 Polyp of colon: Secondary | ICD-10-CM | POA: Diagnosis not present

## 2015-11-25 DIAGNOSIS — E782 Mixed hyperlipidemia: Secondary | ICD-10-CM | POA: Diagnosis not present

## 2015-11-25 DIAGNOSIS — M4802 Spinal stenosis, cervical region: Secondary | ICD-10-CM | POA: Diagnosis not present

## 2015-11-25 DIAGNOSIS — M1991 Primary osteoarthritis, unspecified site: Secondary | ICD-10-CM | POA: Diagnosis not present

## 2015-11-29 DIAGNOSIS — I10 Essential (primary) hypertension: Secondary | ICD-10-CM | POA: Diagnosis not present

## 2015-11-29 DIAGNOSIS — N182 Chronic kidney disease, stage 2 (mild): Secondary | ICD-10-CM | POA: Diagnosis not present

## 2015-11-29 DIAGNOSIS — I482 Chronic atrial fibrillation: Secondary | ICD-10-CM | POA: Diagnosis not present

## 2015-11-29 DIAGNOSIS — Z1389 Encounter for screening for other disorder: Secondary | ICD-10-CM | POA: Diagnosis not present

## 2015-11-29 DIAGNOSIS — K635 Polyp of colon: Secondary | ICD-10-CM | POA: Diagnosis not present

## 2015-11-29 DIAGNOSIS — M4802 Spinal stenosis, cervical region: Secondary | ICD-10-CM | POA: Diagnosis not present

## 2015-11-29 DIAGNOSIS — M1991 Primary osteoarthritis, unspecified site: Secondary | ICD-10-CM | POA: Diagnosis not present

## 2015-11-29 DIAGNOSIS — E782 Mixed hyperlipidemia: Secondary | ICD-10-CM | POA: Diagnosis not present

## 2016-01-09 ENCOUNTER — Ambulatory Visit (INDEPENDENT_AMBULATORY_CARE_PROVIDER_SITE_OTHER): Payer: Medicare Other | Admitting: Cardiology

## 2016-01-09 ENCOUNTER — Encounter: Payer: Self-pay | Admitting: Cardiology

## 2016-01-09 VITALS — BP 122/82 | HR 50 | Ht 72.0 in | Wt 196.0 lb

## 2016-01-09 DIAGNOSIS — I10 Essential (primary) hypertension: Secondary | ICD-10-CM

## 2016-01-09 DIAGNOSIS — I482 Chronic atrial fibrillation, unspecified: Secondary | ICD-10-CM

## 2016-01-09 DIAGNOSIS — N183 Chronic kidney disease, stage 3 (moderate): Secondary | ICD-10-CM | POA: Diagnosis not present

## 2016-01-09 NOTE — Patient Instructions (Signed)
Continue all current medications. Your physician wants you to follow up in:  1 year.  You will receive a reminder letter in the mail one-two months in advance.  If you don't receive a letter, please call our office to schedule the follow up appointment   

## 2016-01-09 NOTE — Progress Notes (Signed)
Cardiology Office Note  Date: 01/09/2016   ID: Lyam, Lamotte April 19, 1941, MRN [id number]  PCP: Curlene Labrum, MD  Primary Cardiologist: Rozann Lesches, MD   Chief Complaint  Patient presents with  . Atrial Fibrillation    History of Present Illness: Travis Santos is a 75 y.o. male last seen in August 2015. He presents for a routine follow-up visit. Does not report any major change in stamina, no palpitations or chest pain. 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  CHADSVASC score is 3. Approximate annual risk of stroke on aspirin is 3.4% versus 1.1% on a DOAC such as Eliquis. We discussed this today, he prefers to stay on aspirin for now.  He is following with Dr. Pleas Koch for primary care, I reviewed his most recent lab work.  Past Medical History  Diagnosis Date  . Essential hypertension, benign   . GERD (gastroesophageal reflux disease)   . Atrial fibrillation ([facility])   . Prostatic hypertrophy   . Arthritis   . Pituitary tumor ([place])     Followed at [facility]    Current Outpatient Prescriptions  Medication Sig Dispense Refill  . amLODipine (NORVASC) 5 MG tablet TAKE 1 TABLET BY MOUTH EVERY MORNING 90 tablet 0  . Digestive Enzymes CAPS Take 1 capsule by mouth daily.     Marland Kitchen docusate sodium (COLACE) 100 MG capsule Take 100 mg by mouth daily.     Marland Kitchen EPINEPHrine (EPI-PEN) 0.3 mg/0.3 mL SOAJ injection Inject 0.3 mLs (0.3 mg total) into the muscle once. 1 Device 1  . hydrochlorothiazide (HYDRODIURIL) 25 MG tablet TAKE (1) TABLET BY MOUTH ONCE DAILY. 90 tablet 0  . lisinopril (PRINIVIL,ZESTRIL) 20 MG tablet TAKE 1 TABLET BY MOUTH EVERY MORNING 90 tablet 0  . mometasone (NASONEX) 50 MCG/ACT nasal spray Place 4 sprays into the nose daily. States he only uses as needed    . potassium chloride (K-DUR) 10 MEQ tablet TAKE (1) TABLET BY MOUTH ONCE DAILY. 90 tablet 0  . simvastatin (ZOCOR) 20 MG tablet TAKE 1 TABLET BY MOUTH AT  BEDTIME. 90 tablet 0   No current facility-administered medications for this visit.   Allergies:  Bee venom   Social History: The patient  reports that he has never smoked. He has never used smokeless tobacco. He reports that he does not drink alcohol or use illicit drugs.   ROS:  Please see the history of present illness. Otherwise, complete review of systems is positive for urinary hesitancy.  All other systems are reviewed and negative.   Physical Exam: VS:  BP 122/82 mmHg  Pulse 50  Ht 6' (1.829 m)  Wt 196 lb (88.905 kg)  BMI 26.58 kg/m2  SpO2 99%, BMI Body mass index is 26.58 kg/(m^2).  Wt Readings from Last 3 Encounters:  01/09/16 196 lb (88.905 kg)  01/06/15 194 lb (87.998 kg)  12/31/14 200 lb (90.719 kg)    Normally nourished appearing, comfortable at rest.  HEENT: Conjunctiva and lids normal, oropharynx clear.  Neck: Supple, no elevated JVP or carotid bruits, no thyromegaly.  Lungs: Clear to auscultation, nonlabored breathing at rest.  Cardiac: Irregularly irregular, no S3 or significant systolic murmur, no pericardial rub.  Extremities: No pitting edema, distal pulses 2+..  Skin: Warm and dry.  Musculoskeletal: No kyphosis.  Neuropsychiatric: Alert and oriented x3, affect grossly appropriate.  ECG: I personally reviewed the prior tracing from 04/06/2014 which showed atrial fibrillation at 53 bpm with nonspecific  ST-T changes.  Recent Labwork:  March 2017: TSH 3.6, BUN 24, creatinine 1.5, potassium 3.8, AST 36, ALT 27, cholesterol 133, triglycerides 103, HDL 40, LDL 72, hemoglobin 15, platelets 188  Other Studies Reviewed Today:  Echocardiogram 03/20/2013: Study Conclusions  - Left ventricle: Mild posterior wall and moderate septal hypertrophy. Systolic function was normal. The estimated ejection fraction was in the range of 60% to 65%. Wall motion was normal; there were no regional wall motion abnormalities. The study is not technically  sufficient to allow evaluation of LV diastolic function, due to underlying atrial fibrillation. - Aortic valve: Trileaflet; mildly thickened leaflets. There was no stenosis. - Mitral valve: Some papillary muscle calcification was seen. - Left atrium: The atrium was mildly dilated. - Tricuspid valve: Mild regurgitation. - Pericardium, extracardiac: A trivial pericardial effusion was identified.  Assessment and Plan:  1. Chronic atrial fibrillation with slow ventricular response. Asymptomatic. He prefers to stay on aspirin as outlined above. We will continue observation.  2. Essential hypertension, blood pressure is adequately controlled today.  3. CKD stage III, recent creatinine 1.5.  Current medicines were reviewed with the patient today.   Orders Placed This Encounter  Procedures  . EKG 12-Lead    Disposition: FU with me in 1 year.   Signed, Satira Sark, MD, [facility] 01/09/2016 12:03 PM    [facility] at [place], [place], [place] [address] Phone: [phone]; Fax: [phone]

## 2016-05-01 ENCOUNTER — Other Ambulatory Visit: Payer: Self-pay

## 2016-06-14 DIAGNOSIS — I482 Chronic atrial fibrillation: Secondary | ICD-10-CM | POA: Diagnosis not present

## 2016-06-14 DIAGNOSIS — E782 Mixed hyperlipidemia: Secondary | ICD-10-CM | POA: Diagnosis not present

## 2016-06-14 DIAGNOSIS — E559 Vitamin D deficiency, unspecified: Secondary | ICD-10-CM | POA: Diagnosis not present

## 2016-06-14 DIAGNOSIS — K635 Polyp of colon: Secondary | ICD-10-CM | POA: Diagnosis not present

## 2016-06-14 DIAGNOSIS — I10 Essential (primary) hypertension: Secondary | ICD-10-CM | POA: Diagnosis not present

## 2016-06-14 DIAGNOSIS — N182 Chronic kidney disease, stage 2 (mild): Secondary | ICD-10-CM | POA: Diagnosis not present

## 2016-06-14 DIAGNOSIS — M1991 Primary osteoarthritis, unspecified site: Secondary | ICD-10-CM | POA: Diagnosis not present

## 2016-06-14 DIAGNOSIS — M4802 Spinal stenosis, cervical region: Secondary | ICD-10-CM | POA: Diagnosis not present

## 2016-06-18 DIAGNOSIS — N182 Chronic kidney disease, stage 2 (mild): Secondary | ICD-10-CM | POA: Diagnosis not present

## 2016-06-18 DIAGNOSIS — Z77090 Contact with and (suspected) exposure to asbestos: Secondary | ICD-10-CM | POA: Diagnosis not present

## 2016-06-18 DIAGNOSIS — Z23 Encounter for immunization: Secondary | ICD-10-CM | POA: Diagnosis not present

## 2016-06-18 DIAGNOSIS — I10 Essential (primary) hypertension: Secondary | ICD-10-CM | POA: Diagnosis not present

## 2016-06-18 DIAGNOSIS — Z0001 Encounter for general adult medical examination with abnormal findings: Secondary | ICD-10-CM | POA: Diagnosis not present

## 2016-06-18 DIAGNOSIS — E782 Mixed hyperlipidemia: Secondary | ICD-10-CM | POA: Diagnosis not present

## 2016-06-18 DIAGNOSIS — E559 Vitamin D deficiency, unspecified: Secondary | ICD-10-CM | POA: Diagnosis not present

## 2016-06-18 DIAGNOSIS — I482 Chronic atrial fibrillation: Secondary | ICD-10-CM | POA: Diagnosis not present

## 2016-06-20 DIAGNOSIS — Z77090 Contact with and (suspected) exposure to asbestos: Secondary | ICD-10-CM | POA: Diagnosis not present

## 2016-06-20 DIAGNOSIS — R05 Cough: Secondary | ICD-10-CM | POA: Diagnosis not present

## 2016-06-20 DIAGNOSIS — R911 Solitary pulmonary nodule: Secondary | ICD-10-CM | POA: Diagnosis not present

## 2016-12-13 DIAGNOSIS — N182 Chronic kidney disease, stage 2 (mild): Secondary | ICD-10-CM | POA: Diagnosis not present

## 2016-12-13 DIAGNOSIS — I482 Chronic atrial fibrillation: Secondary | ICD-10-CM | POA: Diagnosis not present

## 2016-12-13 DIAGNOSIS — E782 Mixed hyperlipidemia: Secondary | ICD-10-CM | POA: Diagnosis not present

## 2016-12-13 DIAGNOSIS — E559 Vitamin D deficiency, unspecified: Secondary | ICD-10-CM | POA: Diagnosis not present

## 2016-12-13 DIAGNOSIS — I10 Essential (primary) hypertension: Secondary | ICD-10-CM | POA: Diagnosis not present

## 2016-12-25 DIAGNOSIS — Z6826 Body mass index (BMI) 26.0-26.9, adult: Secondary | ICD-10-CM | POA: Diagnosis not present

## 2016-12-25 DIAGNOSIS — Z77090 Contact with and (suspected) exposure to asbestos: Secondary | ICD-10-CM | POA: Diagnosis not present

## 2016-12-25 DIAGNOSIS — I482 Chronic atrial fibrillation: Secondary | ICD-10-CM | POA: Diagnosis not present

## 2016-12-25 DIAGNOSIS — I10 Essential (primary) hypertension: Secondary | ICD-10-CM | POA: Diagnosis not present

## 2016-12-25 DIAGNOSIS — E559 Vitamin D deficiency, unspecified: Secondary | ICD-10-CM | POA: Diagnosis not present

## 2016-12-25 DIAGNOSIS — E782 Mixed hyperlipidemia: Secondary | ICD-10-CM | POA: Diagnosis not present

## 2016-12-25 DIAGNOSIS — N182 Chronic kidney disease, stage 2 (mild): Secondary | ICD-10-CM | POA: Diagnosis not present

## 2017-01-16 NOTE — Progress Notes (Signed)
Cardiology Office Note  Date: 01/17/2017   ID: Chaddrick, Brue Apr 05, 1941, MRN [id number]  PCP: Curlene Labrum, MD  Primary Cardiologist: Rozann Lesches, MD   Chief Complaint  Patient presents with  . Atrial Fibrillation    History of Present Illness: Travis Santos is a 76 y.o. male last seen in May 2017. He presents for a routine follow-up visit. Reports no major changes in stamina, no palpitations, lightheadedness, or syncope. He continues to enjoy working outdoors, mowing his lawn and doing other chores.  CHADSVASC score is 3. We have discussed stroke prophylaxis with anticoagulation, he prefers to stay on aspirin. He has not required any medications for heart rate control.  He had recent lab work done with Dr. Pleas Koch which is outlined below.  I personally reviewed the tracing from today which shows atrial fibrillation with slow ventricular response, poor R-wave progression, nonspecific ST-T changes.  Past Medical History:  Diagnosis Date  . Arthritis   . Atrial fibrillation ([place])   . Essential hypertension, benign   . GERD (gastroesophageal reflux disease)   . Pituitary tumor    Followed at [facility]  . Prostatic hypertrophy     Past Surgical History:  Procedure Laterality Date  . COLONOSCOPY  02/07/04   Dr. Lindalou Hose: tubular adenoma  . COLONOSCOPY N/A 12/31/2014   Procedure: COLONOSCOPY;  Surgeon: Danie Binder, MD;  [facility];  Service: Endoscopy;  Laterality: N/A;  1215  . HYDROCELE EXCISION Right 08/25/2013   Procedure: HYDROCELECTOMY ADULT;  Surgeon: Irine Seal, MD;  Location: [facility];  Service: Urology;  Laterality: Right;  . NASAL POLYP SURGERY  2007  . TRANSURETHRAL RESECTION OF PROSTATE N/A 08/25/2013   Procedure: TRANSURETHRAL RESECTION OF THE PROSTATE WITH GYRUS INSTRUMENTS;  Surgeon: Irine Seal, MD;  Location: [facility];  Service: Urology;  Laterality: N/A;  . TUMOR REMOVAL  2006   Tumor removed from left clavical area, "softball  size"  . TUMOR REMOVAL  2014   on nose, radiation treatments  . VEIN SURGERY Bilateral 10 years ago   "having trouble with phlebitis so dried up veins"    Current Outpatient Prescriptions  Medication Sig Dispense Refill  . amLODipine (NORVASC) 5 MG tablet TAKE 1 TABLET BY MOUTH EVERY MORNING 90 tablet 0  . aspirin EC 81 MG tablet Take 81 mg by mouth daily.    Marland Kitchen atorvastatin (LIPITOR) 20 MG tablet Take 20 mg by mouth daily.    . Cholecalciferol (VITAMIN D3) 2000 units TABS Take by mouth.    . Digestive Enzymes CAPS Take 1 capsule by mouth daily.     Marland Kitchen docusate sodium (COLACE) 100 MG capsule Take 100 mg by mouth daily.     Marland Kitchen lisinopril (PRINIVIL,ZESTRIL) 20 MG tablet TAKE 1 TABLET BY MOUTH EVERY MORNING 90 tablet 0  . potassium chloride (K-DUR) 10 MEQ tablet TAKE (1) TABLET BY MOUTH ONCE DAILY. 90 tablet 0   No current facility-administered medications for this visit.    Allergies:  Bee venom   Social History: The patient  reports that he has never smoked. He has never used smokeless tobacco. He reports that he does not drink alcohol or use drugs.   ROS:  Please see the history of present illness. Otherwise, complete review of systems is positive for seasonal allergies.  All other systems are reviewed and negative.   Physical Exam: VS:  BP (!) 138/94   Pulse (!) 43   Ht 6' (1.829 m)   [employer] [facility]  191 lb (86.6 kg)   SpO2 98%   BMI 25.90 kg/m , BMI Body mass index is 25.9 kg/m.  Wt Readings from Last 3 Encounters:  01/17/17 191 lb (86.6 kg)  01/09/16 196 lb (88.9 kg)  01/06/15 194 lb (88 kg)    Normally nourished appearing elderly male, comfortable at rest.  HEENT: Conjunctiva and lids normal, oropharynx clear.  Neck: Supple, no elevated JVP or carotid bruits, no thyromegaly.  Lungs: Clear to auscultation, nonlabored breathing at rest.  Cardiac: Irregularly irregular, no S3 or significant systolic murmur, no pericardial rub.  Extremities: No pitting edema, distal pulses 2+..   Skin: Warm and dry.  Musculoskeletal: No kyphosis.  Neuropsychiatric: Alert and oriented x3, affect grossly appropriate.  ECG: I personally reviewed the tracing from 01/09/2016 which showed atrial fibrillation with nonspecific ST-T changes.  Recent Labwork:  April 2018: BUN 14, creatinine 1.4, potassium 3.7, AST 29, ALT 23, hemoglobin 14.2, platelets 161, cholesterol 135, triglycerides 65, HDL 49, LDL 73, TSH 2.9  Other Studies Reviewed Today:  Echocardiogram 03/20/2013: Study Conclusions  - Left ventricle: Mild posterior wall and moderate septal hypertrophy. Systolic function was normal. The estimated ejection fraction was in the range of 60% to 65%. Wall motion was normal; there were no regional wall motion abnormalities. The study is not technically sufficient to allow evaluation of LV diastolic function, due to underlying atrial fibrillation. - Aortic valve: Trileaflet; mildly thickened leaflets. There was no stenosis. - Mitral valve: Some papillary muscle calcification was seen. - Left atrium: The atrium was mildly dilated. - Tricuspid valve: Mild regurgitation. - Pericardium, extracardiac: A trivial pericardial effusion was identified.  Assessment and Plan:  1. Chronic atrial fibrillation, asymptomatic in terms of palpitations. He is bradycardic at baseline with suspected underlying conduction system disease, but has had no lightheadedness or syncope. He does not require any heart rate control medications at this point. CHADSVASC score is 3, he continues to decline anticoagulation, prefers to stay on aspirin.   2. Essential hypertension, systolic blood pressure in the 130s. He continues to tolerate Norvasc.  3. 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 Pleas Koch.  4. Hyperlipidemia, continues on Lipitor with good LDL control, recently 73.  Current medicines were reviewed with the patient today.   Orders Placed This Encounter   Procedures  . EKG 12-Lead    Disposition: Follow-up in one year, sooner if needed.  Signed, Satira Sark, MD, [facility] 01/17/2017 8:19 AM    [facility] at [place], [place], [place] [address] Phone: [phone]; Fax: [phone]

## 2017-01-17 ENCOUNTER — Encounter: Payer: Self-pay | Admitting: Cardiology

## 2017-01-17 ENCOUNTER — Ambulatory Visit (INDEPENDENT_AMBULATORY_CARE_PROVIDER_SITE_OTHER): Payer: Medicare Other | Admitting: Cardiology

## 2017-01-17 VITALS — BP 138/94 | HR 43 | Ht 72.0 in | Wt 191.0 lb

## 2017-01-17 DIAGNOSIS — I482 Chronic atrial fibrillation, unspecified: Secondary | ICD-10-CM

## 2017-01-17 DIAGNOSIS — N183 Chronic kidney disease, stage 3 unspecified: Secondary | ICD-10-CM

## 2017-01-17 DIAGNOSIS — I10 Essential (primary) hypertension: Secondary | ICD-10-CM

## 2017-01-17 DIAGNOSIS — E782 Mixed hyperlipidemia: Secondary | ICD-10-CM | POA: Diagnosis not present

## 2017-01-17 NOTE — Patient Instructions (Signed)
Medication Instructions:  Your physician recommends that you continue on your current medications as directed. Please refer to the Current Medication list given to you today.  Labwork: none  Testing/Procedures: none  Follow-Up: Your physician wants you to follow-up in: 1 year with Dr. Domenic Polite. You will receive a reminder letter in the mail two months in advance. If you don't receive a letter, please call our office to schedule the follow-up appointment.  Any Other Special Instructions Will Be Listed Below (If Applicable).  If you need a refill on your cardiac medications before your next appointment, please call your pharmacy.

## 2017-11-25 ENCOUNTER — Encounter: Payer: Self-pay | Admitting: Gastroenterology

## 2018-01-17 ENCOUNTER — Ambulatory Visit: Payer: Medicare Other | Admitting: Cardiology

## 2018-01-17 DIAGNOSIS — I482 Chronic atrial fibrillation: Secondary | ICD-10-CM | POA: Diagnosis not present

## 2018-01-17 DIAGNOSIS — E782 Mixed hyperlipidemia: Secondary | ICD-10-CM | POA: Diagnosis not present

## 2018-01-17 DIAGNOSIS — N182 Chronic kidney disease, stage 2 (mild): Secondary | ICD-10-CM | POA: Diagnosis not present

## 2018-01-17 DIAGNOSIS — M1991 Primary osteoarthritis, unspecified site: Secondary | ICD-10-CM | POA: Diagnosis not present

## 2018-01-17 DIAGNOSIS — E559 Vitamin D deficiency, unspecified: Secondary | ICD-10-CM | POA: Diagnosis not present

## 2018-01-17 DIAGNOSIS — I10 Essential (primary) hypertension: Secondary | ICD-10-CM | POA: Diagnosis not present

## 2018-01-22 DIAGNOSIS — Z23 Encounter for immunization: Secondary | ICD-10-CM | POA: Diagnosis not present

## 2018-01-22 DIAGNOSIS — I482 Chronic atrial fibrillation: Secondary | ICD-10-CM | POA: Diagnosis not present

## 2018-01-22 DIAGNOSIS — I10 Essential (primary) hypertension: Secondary | ICD-10-CM | POA: Diagnosis not present

## 2018-01-22 DIAGNOSIS — Z0001 Encounter for general adult medical examination with abnormal findings: Secondary | ICD-10-CM | POA: Diagnosis not present

## 2018-01-22 DIAGNOSIS — Z6826 Body mass index (BMI) 26.0-26.9, adult: Secondary | ICD-10-CM | POA: Diagnosis not present

## 2018-01-22 DIAGNOSIS — E782 Mixed hyperlipidemia: Secondary | ICD-10-CM | POA: Diagnosis not present

## 2018-01-22 DIAGNOSIS — N182 Chronic kidney disease, stage 2 (mild): Secondary | ICD-10-CM | POA: Diagnosis not present

## 2018-01-22 DIAGNOSIS — E559 Vitamin D deficiency, unspecified: Secondary | ICD-10-CM | POA: Diagnosis not present

## 2018-03-04 NOTE — Progress Notes (Signed)
Cardiology Office Note  Date: 03/06/2018   ID: Travis, Santos February 27, 1941, MRN [id number]  PCP: Curlene Labrum, MD  Primary Cardiologist: Rozann Lesches, MD   Chief Complaint  Patient presents with  . Atrial Fibrillation    History of Present Illness: Travis Santos is a 77 y.o. male last seen in May 2018.  He is here for a follow-up visit today.  Reports no change in stamina, no unusual fatigue, lightheadedness, or syncope.  He does not experience any palpitations and reports no chest pain with typical activities.  Tries to stay active outdoors, still drives his tractor and cuts his grass.  CHADSVASC score is 3. He has declined anticoagulation as we have discussed this over time.  He does take a baby aspirin daily.  I personally reviewed his ECG today which shows atrial fibrillation with nonspecific ST-T changes.  He remains bradycardic but not clearly symptomatic, and is currently not on any AV nodal blockers.  Past Medical History:  Diagnosis Date  . Arthritis   . Atrial fibrillation ([place])   . Essential hypertension, benign   . GERD (gastroesophageal reflux disease)   . Pituitary tumor    Followed at [facility]  . Prostatic hypertrophy     Past Surgical History:  Procedure Laterality Date  . COLONOSCOPY  02/07/04   Dr. Lindalou Hose: tubular adenoma  . COLONOSCOPY N/A 12/31/2014   Procedure: COLONOSCOPY;  Surgeon: Danie Binder, MD;  [facility];  Service: Endoscopy;  Laterality: N/A;  1215  . HYDROCELE EXCISION Right 08/25/2013   Procedure: HYDROCELECTOMY ADULT;  Surgeon: Irine Seal, MD;  [facility];  Service: Urology;  Laterality: Right;  . NASAL POLYP SURGERY  2007  . TRANSURETHRAL RESECTION OF PROSTATE N/A 08/25/2013   Procedure: TRANSURETHRAL RESECTION OF THE PROSTATE WITH GYRUS INSTRUMENTS;  Surgeon: Irine Seal, MD;  [facility];  Service: Urology;  Laterality: N/A;  . TUMOR REMOVAL  2006   Tumor removed from left clavical area,  "softball size"  . TUMOR REMOVAL  2014   on nose, radiation treatments  . VEIN SURGERY Bilateral 10 years ago   "having trouble with phlebitis so dried up veins"    Current Outpatient Medications  Medication Sig Dispense Refill  . amLODipine (NORVASC) 5 MG tablet TAKE 1 TABLET BY MOUTH EVERY MORNING 90 tablet 0  . aspirin EC 81 MG tablet Take 81 mg by mouth daily.    Marland Kitchen atorvastatin (LIPITOR) 20 MG tablet Take 20 mg by mouth daily.    . Cholecalciferol (VITAMIN D3) 2000 units TABS Take 1 tablet by mouth daily.     . Digestive Enzymes CAPS Take 1 capsule by mouth daily.     Marland Kitchen docusate sodium (COLACE) 100 MG capsule Take 100 mg by mouth daily.     Marland Kitchen lisinopril (PRINIVIL,ZESTRIL) 20 MG tablet TAKE 1 TABLET BY MOUTH EVERY MORNING 90 tablet 0  . potassium chloride (K-DUR) 10 MEQ tablet TAKE (1) TABLET BY MOUTH ONCE DAILY. 90 tablet 0   No current facility-administered medications for this visit.    Allergies:  Bee venom   Social History: The patient  reports that he has never smoked. He has never used smokeless tobacco. He reports that he does not drink alcohol or use drugs.   ROS:  Please see the history of present illness. Otherwise, complete review of systems is positive for arthritic pains and stiffness.  All other systems are reviewed and negative.   Physical Exam: VS:  BP 136/78   Pulse (!) 52   Ht 6' (1.829 m)   Wt 195 lb 12.8 oz (88.8 kg)   SpO2 99%   BMI 26.56 kg/m , BMI Body mass index is 26.56 kg/m.  Wt Readings from Last 3 Encounters:  03/06/18 195 lb 12.8 oz (88.8 kg)  01/17/17 191 lb (86.6 kg)  01/09/16 196 lb (88.9 kg)    General: Patient appears comfortable at rest. HEENT: Conjunctiva and lids normal, oropharynx clear. Neck: Supple, no elevated JVP or carotid bruits, no thyromegaly. Lungs: Clear to auscultation, nonlabored breathing at rest. Cardiac: Irregularly irregular, no S3 or significant systolic murmur. Abdomen: Soft, nontender, bowel sounds  present. Extremities: No pitting edema, distal pulses 2+. Skin: Warm and dry. Musculoskeletal: No kyphosis. Neuropsychiatric: Alert and oriented x3, affect grossly appropriate.  ECG: I personally reviewed the tracing from 01/17/2017 which showed atrial fibrillation with slow ventricular response, poor R wave progression, nonspecific ST-T changes.  Recent Labwork:  April 2018: BUN 14, creatinine 1.4, potassium 3.7, AST 29, ALT 23, hemoglobin 14.2, platelets 161, cholesterol 135, triglycerides 65, HDL 49, LDL 73, TSH 2.9  Other Studies Reviewed Today:  Echocardiogram 03/20/2013: Study Conclusions  - Left ventricle: Mild posterior wall and moderate septal hypertrophy. Systolic function was normal. The estimated ejection fraction was in the range of 60% to 65%. Wall motion was normal; there were no regional wall motion abnormalities. The study is not technically sufficient to allow evaluation of LV diastolic function, due to underlying atrial fibrillation. - Aortic valve: Trileaflet; mildly thickened leaflets. There was no stenosis. - Mitral valve: Some papillary muscle calcification was seen. - Left atrium: The atrium was mildly dilated. - Tricuspid valve: Mild regurgitation. - Pericardium, extracardiac: A trivial pericardial effusion was identified.  Assessment and Plan:  1.  Permanent atrial fibrillation.  CHADSVASC score is 3, he has declined anticoagulation but continues on baby aspirin daily.  Not requiring any AV nodal blockers for heart rate control with baseline bradycardia and conduction system disease.  2.  Bradycardia, not clearly symptomatic at this point.  We have discussed warning signs and symptoms that would prompt further evaluation.  3.  Mixed hyperlipidemia on Lipitor.  He continues to follow with Dr. Pleas Koch.  4.  CKD stage 3, last creatinine 1.4.  Current medicines were reviewed with the patient today.   Orders Placed This Encounter   Procedures  . EKG 12-Lead    Disposition: Follow-up in 1 year, sooner if needed.  Signed, Satira Sark, MD, [facility] 03/06/2018 10:19 AM    [facility] at [place], [place], [place] [address] Phone: [phone]; Fax: [phone]

## 2018-03-06 ENCOUNTER — Ambulatory Visit (INDEPENDENT_AMBULATORY_CARE_PROVIDER_SITE_OTHER): Payer: Medicare Other | Admitting: Cardiology

## 2018-03-06 ENCOUNTER — Encounter: Payer: Self-pay | Admitting: Cardiology

## 2018-03-06 VITALS — BP 136/78 | HR 52 | Ht 72.0 in | Wt 195.8 lb

## 2018-03-06 DIAGNOSIS — R001 Bradycardia, unspecified: Secondary | ICD-10-CM | POA: Diagnosis not present

## 2018-03-06 DIAGNOSIS — N183 Chronic kidney disease, stage 3 unspecified: Secondary | ICD-10-CM

## 2018-03-06 DIAGNOSIS — E782 Mixed hyperlipidemia: Secondary | ICD-10-CM

## 2018-03-06 DIAGNOSIS — I4821 Permanent atrial fibrillation: Secondary | ICD-10-CM

## 2018-03-06 DIAGNOSIS — I482 Chronic atrial fibrillation: Secondary | ICD-10-CM | POA: Diagnosis not present

## 2018-03-06 NOTE — Patient Instructions (Signed)

## 2018-07-18 DIAGNOSIS — I10 Essential (primary) hypertension: Secondary | ICD-10-CM | POA: Diagnosis not present

## 2018-07-18 DIAGNOSIS — M1991 Primary osteoarthritis, unspecified site: Secondary | ICD-10-CM | POA: Diagnosis not present

## 2018-07-18 DIAGNOSIS — E559 Vitamin D deficiency, unspecified: Secondary | ICD-10-CM | POA: Diagnosis not present

## 2018-07-18 DIAGNOSIS — E782 Mixed hyperlipidemia: Secondary | ICD-10-CM | POA: Diagnosis not present

## 2018-07-18 DIAGNOSIS — N182 Chronic kidney disease, stage 2 (mild): Secondary | ICD-10-CM | POA: Diagnosis not present

## 2018-07-21 DIAGNOSIS — Z23 Encounter for immunization: Secondary | ICD-10-CM | POA: Diagnosis not present

## 2018-07-21 DIAGNOSIS — Z77090 Contact with and (suspected) exposure to asbestos: Secondary | ICD-10-CM | POA: Diagnosis not present

## 2018-07-21 DIAGNOSIS — N183 Chronic kidney disease, stage 3 (moderate): Secondary | ICD-10-CM | POA: Diagnosis not present

## 2018-07-21 DIAGNOSIS — I10 Essential (primary) hypertension: Secondary | ICD-10-CM | POA: Diagnosis not present

## 2018-07-21 DIAGNOSIS — E559 Vitamin D deficiency, unspecified: Secondary | ICD-10-CM | POA: Diagnosis not present

## 2018-07-21 DIAGNOSIS — I4821 Permanent atrial fibrillation: Secondary | ICD-10-CM | POA: Diagnosis not present

## 2018-07-21 DIAGNOSIS — E782 Mixed hyperlipidemia: Secondary | ICD-10-CM | POA: Diagnosis not present

## 2018-07-21 DIAGNOSIS — Z6826 Body mass index (BMI) 26.0-26.9, adult: Secondary | ICD-10-CM | POA: Diagnosis not present

## 2018-07-23 DIAGNOSIS — I10 Essential (primary) hypertension: Secondary | ICD-10-CM | POA: Diagnosis not present

## 2018-07-23 DIAGNOSIS — R200 Anesthesia of skin: Secondary | ICD-10-CM | POA: Diagnosis not present

## 2018-07-23 DIAGNOSIS — I639 Cerebral infarction, unspecified: Secondary | ICD-10-CM | POA: Diagnosis not present

## 2018-10-31 DIAGNOSIS — H40013 Open angle with borderline findings, low risk, bilateral: Secondary | ICD-10-CM | POA: Diagnosis not present

## 2018-10-31 DIAGNOSIS — D497 Neoplasm of unspecified behavior of endocrine glands and other parts of nervous system: Secondary | ICD-10-CM | POA: Diagnosis not present

## 2018-10-31 DIAGNOSIS — H25041 Posterior subcapsular polar age-related cataract, right eye: Secondary | ICD-10-CM | POA: Diagnosis not present

## 2018-10-31 DIAGNOSIS — H35033 Hypertensive retinopathy, bilateral: Secondary | ICD-10-CM | POA: Diagnosis not present

## 2018-12-17 DIAGNOSIS — H40013 Open angle with borderline findings, low risk, bilateral: Secondary | ICD-10-CM | POA: Diagnosis not present

## 2018-12-17 DIAGNOSIS — H2513 Age-related nuclear cataract, bilateral: Secondary | ICD-10-CM | POA: Diagnosis not present

## 2018-12-17 DIAGNOSIS — H25013 Cortical age-related cataract, bilateral: Secondary | ICD-10-CM | POA: Diagnosis not present

## 2018-12-17 DIAGNOSIS — H35033 Hypertensive retinopathy, bilateral: Secondary | ICD-10-CM | POA: Diagnosis not present

## 2019-01-06 DIAGNOSIS — H2513 Age-related nuclear cataract, bilateral: Secondary | ICD-10-CM | POA: Diagnosis not present

## 2019-01-06 DIAGNOSIS — H25811 Combined forms of age-related cataract, right eye: Secondary | ICD-10-CM | POA: Diagnosis not present

## 2019-01-06 DIAGNOSIS — H25013 Cortical age-related cataract, bilateral: Secondary | ICD-10-CM | POA: Diagnosis not present

## 2019-02-04 DIAGNOSIS — H25012 Cortical age-related cataract, left eye: Secondary | ICD-10-CM | POA: Diagnosis not present

## 2019-02-04 DIAGNOSIS — H2512 Age-related nuclear cataract, left eye: Secondary | ICD-10-CM | POA: Diagnosis not present

## 2019-03-25 ENCOUNTER — Other Ambulatory Visit: Payer: Self-pay

## 2019-03-31 DIAGNOSIS — H2512 Age-related nuclear cataract, left eye: Secondary | ICD-10-CM | POA: Diagnosis not present

## 2019-03-31 DIAGNOSIS — H25012 Cortical age-related cataract, left eye: Secondary | ICD-10-CM | POA: Diagnosis not present

## 2019-05-21 ENCOUNTER — Telehealth: Payer: Self-pay | Admitting: Cardiology

## 2019-05-21 NOTE — Telephone Encounter (Signed)
Virtual Visit Pre-Appointment Phone Call  "(Name), I am calling you today to discuss your upcoming appointment. We are currently trying to limit exposure to the virus that causes COVID-19 by seeing patients at home rather than in the office."  1. "What is the BEST phone number to call the day of the visit?" - include this in appointment notes  2. Do you have or have access to (through a family member/friend) a smartphone with video capability that we can use for your visit?" a. If yes - list this number in appt notes as cell (if different from BEST phone #) and list the appointment type as a VIDEO visit in appointment notes b. If no - list the appointment type as a PHONE visit in appointment notes  3. Confirm consent - "In the setting of the current Covid19 crisis, you are scheduled for a (phone or video) visit with your provider on (date) at (time).  Just as we do with many in-office visits, in order for you to participate in this visit, we must obtain consent.  If you'd like, I can send this to your mychart (if signed up) or email for you to review.  Otherwise, I can obtain your verbal consent now.  All virtual visits are billed to your insurance company just like a normal visit would be.  By agreeing to a virtual visit, we'd like you to understand that the technology does not allow for your provider to perform an examination, and thus may limit your provider's ability to fully assess your condition. If your provider identifies any concerns that need to be evaluated in person, we will make arrangements to do so.  Finally, though the technology is pretty good, we cannot assure that it will always work on either your or our end, and in the setting of a video visit, we may have to convert it to a phone-only visit.  In either situation, we cannot ensure that we have a secure connection.  Are you willing to proceed?" STAFF: Did the patient verbally acknowledge consent to telehealth visit? Document  YES/NO here: yes  4. Advise patient to be prepared - "Two hours prior to your appointment, go ahead and check your blood pressure, pulse, oxygen saturation, and your weight (if you have the equipment to check those) and write them all down. When your visit starts, your provider will ask you for this information. If you have an Apple Watch or Kardia device, please plan to have heart rate information ready on the day of your appointment. Please have a pen and paper handy nearby the day of the visit as well."  5. Give patient instructions for MyChart download to smartphone OR Doximity/Doxy.me as below if video visit (depending on what platform provider is using)  6. Inform patient they will receive a phone call 15 minutes prior to their appointment time (may be from unknown caller ID) so they should be prepared to answer    TELEPHONE CALL NOTE  RAVI KAUK has been deemed a candidate for a follow-up tele-health visit to limit community exposure during the Covid-19 pandemic. I spoke with the patient via phone to ensure availability of phone/video source, confirm preferred email & phone number, and discuss instructions and expectations.  I reminded TENG BURROWES to be prepared with any vital sign and/or heart rhythm information that could potentially be obtained via home monitoring, at the time of his visit. I reminded JYAIR HAMMERSLEY to expect a phone call prior to  his visit.  Weston Anna 05/21/2019 9:31 AM   INSTRUCTIONS FOR DOWNLOADING THE MYCHART APP TO SMARTPHONE  - The patient must first make sure to have activated MyChart and know their login information - If Apple, go to [employer] and type in MyChart in the search bar and download the app. If Android, ask patient to go to [employer] and type in [place] in the search bar and download the app. The app is free but as with any other app downloads, their phone may require them to verify saved payment information or  Apple/Android password.  - The patient will need to then log into the app with their MyChart username and password, and select [facility] as their healthcare provider to link the account. When it is time for your visit, go to the MyChart app, find appointments, and click Begin Video Visit. Be sure to Select Allow for your device to access the Microphone and Camera for your visit. You will then be connected, and your provider will be with you shortly.  **If they have any issues connecting, or need assistance please contact MyChart service desk [phone])**  **If using a computer, in order to ensure the best quality for their visit they will need to use either of the following Internet Browsers: [employer], or Google Chrome**  IF USING DOXIMITY or DOXY.ME - The patient will receive a link just prior to their visit by text.     FULL LENGTH CONSENT FOR TELE-HEALTH VISIT   I hereby voluntarily request, consent and authorize [facility] and its employed or contracted physicians, physician assistants, nurse practitioners or other licensed health care professionals (the Practitioner), to provide me with telemedicine health care services (the Services") as deemed necessary by the treating Practitioner. I acknowledge and consent to receive the Services by the Practitioner via telemedicine. I understand that the telemedicine visit will involve communicating with the Practitioner through live audiovisual communication technology and the disclosure of certain medical information by electronic transmission. I acknowledge that I have been given the opportunity to request an in-person assessment or other available alternative prior to the telemedicine visit and am voluntarily participating in the telemedicine visit.  I understand that I have the right to withhold or withdraw my consent to the use of telemedicine in the course of my care at any time, without affecting my right to future care  or treatment, and that the Practitioner or I may terminate the telemedicine visit at any time. I understand that I have the right to inspect all information obtained and/or recorded in the course of the telemedicine visit and may receive copies of available information for a reasonable fee.  I understand that some of the potential risks of receiving the Services via telemedicine include:   Delay or interruption in medical evaluation due to technological equipment failure or disruption;  Information transmitted may not be sufficient (e.g. poor resolution of images) to allow for appropriate medical decision making by the Practitioner; and/or   In rare instances, security protocols could fail, causing a breach of personal health information.  Furthermore, I acknowledge that it is my responsibility to provide information about my medical history, conditions and care that is complete and accurate to the best of my ability. I acknowledge that Practitioner's advice, recommendations, and/or decision may be based on factors not within their control, such as incomplete or inaccurate data provided by me or distortions of diagnostic images or specimens that may result from electronic transmissions. I  understand that the practice of medicine is not an exact science and that Practitioner makes no warranties or guarantees regarding treatment outcomes. I acknowledge that I will receive a copy of this consent concurrently upon execution via email to the email address I last provided but may also request a printed copy by calling the office of [facility].    I understand that my insurance will be billed for this visit.   I have read or had this consent read to me.  I understand the contents of this consent, which adequately explains the benefits and risks of the Services being provided via telemedicine.   I have been provided ample opportunity to ask questions regarding this consent and the Services and have had  my questions answered to my satisfaction.  I give my informed consent for the services to be provided through the use of telemedicine in my medical care  By participating in this telemedicine visit I agree to the above.

## 2019-06-03 ENCOUNTER — Encounter: Payer: Self-pay | Admitting: Cardiology

## 2019-06-03 ENCOUNTER — Telehealth (INDEPENDENT_AMBULATORY_CARE_PROVIDER_SITE_OTHER): Payer: Medicare Other | Admitting: Cardiology

## 2019-06-03 VITALS — Ht 72.0 in | Wt 195.0 lb

## 2019-06-03 DIAGNOSIS — R001 Bradycardia, unspecified: Secondary | ICD-10-CM | POA: Diagnosis not present

## 2019-06-03 DIAGNOSIS — E782 Mixed hyperlipidemia: Secondary | ICD-10-CM

## 2019-06-03 DIAGNOSIS — N1832 Chronic kidney disease, stage 3b: Secondary | ICD-10-CM | POA: Diagnosis not present

## 2019-06-03 DIAGNOSIS — I4821 Permanent atrial fibrillation: Secondary | ICD-10-CM | POA: Diagnosis not present

## 2019-06-03 NOTE — Progress Notes (Signed)
Virtual Visit via Telephone Note   This visit type was conducted due to national recommendations for restrictions regarding the COVID-19 Pandemic (e.g. social distancing) in an effort to limit this patient's exposure and mitigate transmission in our community.  Due to his co-morbid illnesses, this patient is at least at moderate risk for complications without adequate follow up.  This format is felt to be most appropriate for this patient at this time.  The patient did not have access to video technology/had technical difficulties with video requiring transitioning to audio format only (telephone).  All issues noted in this document were discussed and addressed.  No physical exam could be performed with this format.  Please refer to the patient's chart for his  consent to telehealth for [facility].   Date:  06/03/2019   ID:  Nehorai, Rehbein 1941-03-12, MRN [id number]  Patient Location: Home Provider Location: Office  PCP:  Curlene Labrum, MD  Cardiologist:  Rozann Lesches, MD Electrophysiologist:  None   Evaluation Performed:  Follow-Up Visit  Chief Complaint:   Cardiac follow-up  History of Present Illness:    ROSARIO MIRKIN is a 78 y.o. male last seen in July 2019.  We spoke by phone today.  He does not report any sense of palpitations, no exertional chest pain, stable NYHA class II dyspnea.  He remains functional with ADLs including yard work.  CHADSVASC score is 3, he has declined anticoagulation.  We went over his medications which are outlined below.  He takes aspirin and statin therapy, has not required AV nodal blocker for heart rate control and in fact has bradycardia at baseline.  He is not clearly symptomatic however, no dizziness or syncope.  He does not report interval follow-up with PCP, I asked him to make a visit with Dr. Pleas Koch and consider follow-up lab work.  The patient does not have symptoms concerning for COVID-19 infection (fever, chills, cough,  or new shortness of breath).    Past Medical History:  Diagnosis Date  . Arthritis   . Atrial fibrillation ([place])   . Essential hypertension   . GERD (gastroesophageal reflux disease)   . Pituitary tumor    [facility]  . Prostatic hypertrophy    Past Surgical History:  Procedure Laterality Date  . COLONOSCOPY  02/07/04   Dr. Lindalou Hose: tubular adenoma  . COLONOSCOPY N/A 12/31/2014   Procedure: COLONOSCOPY;  Surgeon: Danie Binder, MD;  [facility];  Service: Endoscopy;  Laterality: N/A;  1215  . HYDROCELE EXCISION Right 08/25/2013   Procedure: HYDROCELECTOMY ADULT;  Surgeon: Irine Seal, MD;  Location: [facility] OR[facility];  Service: Urology;  Laterality: Right;  . NASAL POLYP SURGERY  2007  . TRANSURETHRAL RESECTION OF PROSTATE N/A 08/25/2013   Procedure: TRANSURETHRAL RESECTION OF THE PROSTATE WITH GYRUS INSTRUMENTS;  Surgeon: Irine Seal, MD;  Location: [facility];  Service: Urology;  Laterality: N/A;  . TUMOR REMOVAL  2006   Tumor removed from left clavical area, "softball size"  . TUMOR REMOVAL  2014   on nose, radiation treatments  . VEIN SURGERY Bilateral 10 years ago   "having trouble with phlebitis so dried up veins"     Current Meds  Medication Sig  . amLODipine (NORVASC) 5 MG tablet TAKE 1 TABLET BY MOUTH EVERY MORNING  . aspirin EC 81 MG tablet Take 81 mg by mouth daily.  Marland Kitchen atorvastatin (LIPITOR) 20 MG tablet Take 20 mg by mouth daily.  . Cholecalciferol (VITAMIN D3) 2000  units TABS Take 1 tablet by mouth daily.   . Digestive Enzymes CAPS Take 1 capsule by mouth daily.   Marland Kitchen docusate sodium (COLACE) 100 MG capsule Take 100 mg by mouth daily.   Marland Kitchen lisinopril (PRINIVIL,ZESTRIL) 20 MG tablet TAKE 1 TABLET BY MOUTH EVERY MORNING  . potassium chloride (K-DUR) 10 MEQ tablet TAKE (1) TABLET BY MOUTH ONCE DAILY.     Allergies:   Bee venom   Social History   Tobacco Use  . Smoking status: Never Smoker  . Smokeless tobacco: Never Used  Substance Use Topics  . Alcohol  use: No    Alcohol/week: 0.0 standard drinks  . Drug use: No     Family Hx: The patient's family history includes Diabetes in his son; Hypertension in his daughter, father, mother, and sister; Stroke in his brother and mother. There is no history of Colon cancer.  ROS:   Please see the history of present illness.    Dry eyes. All other systems reviewed and are negative.   Prior CV studies:   The following studies were reviewed today:  Echocardiogram 03/20/2013: Study Conclusions  - Left ventricle: Mild posterior wall and moderate septal hypertrophy. Systolic function was normal. The estimated ejection fraction was in the range of 60% to 65%. Wall motion was normal; there were no regional wall motion abnormalities. The study is not technically sufficient to allow evaluation of LV diastolic function, due to underlying atrial fibrillation. - Aortic valve: Trileaflet; mildly thickened leaflets. There was no stenosis. - Mitral valve: Some papillary muscle calcification was seen. - Left atrium: The atrium was mildly dilated. - Tricuspid valve: Mild regurgitation. - Pericardium, extracardiac: A trivial pericardial effusion was identified.  Labs/Other Tests and Data Reviewed:    EKG:  An ECG dated 03/06/2018 was personally reviewed today and demonstrated:  Atrial fibrillation with nonspecific ST-T changes.  Recent Labs:  April 2018: BUN 14, creatinine 1.4, potassium 3.7, AST 29, ALT 23, hemoglobin 14.2, platelets 161, cholesterol 135, triglycerides 65, HDL 49, LDL 73, TSH 2.9  Wt Readings from Last 3 Encounters:  06/03/19 195 lb (88.5 kg)  03/06/18 195 lb 12.8 oz (88.8 kg)  01/17/17 191 lb (86.6 kg)     Objective:    Vital Signs:  Ht 6' (1.829 m)   Wt 195 lb (88.5 kg)   BMI 26.45 kg/m    He did not have a way to check vital signs today. Patient spoke in full sentences, not short of breath. No audible wheezing or coughing. Speech pattern normal.   ASSESSMENT & PLAN:    1.  Permanent atrial fibrillation with CHADSVASC score of 3.  He declines anticoagulation, remains on aspirin daily.  With resting bradycardia he has not required any AV nodal blockers.  No palpitations reported.  Continue with observation.  2.  Mixed hyperlipidemia, continues on Lipitor.  I recommended that he follow-up with Dr. Pleas Koch for repeat lab work.  3.  CKD stage III, last creatinine 1.4 in April 2018 by my records.  I recommended that he follow-up with Dr. Pleas Koch for repeat lab work.  4.  Chronic bradycardia, does not appear to be symptomatic, no description of dizziness or syncope.  Continue observation.  COVID-19 Education: The signs and symptoms of COVID-19 were discussed with the patient and how to seek care for testing (follow up with PCP or arrange E-visit).  The importance of social distancing was discussed today.  Time:   Today, I have spent 7 minutes with the patient with  telehealth technology discussing the above problems.     Medication Adjustments/Labs and Tests Ordered: Current medicines are reviewed at length with the patient today.  Concerns regarding medicines are outlined above.   Tests Ordered: No orders of the defined types were placed in this encounter.   Medication Changes: No orders of the defined types were placed in this encounter.   Follow Up:  In Person 1 year in the [place] office.  Signed, Rozann Lesches, MD  06/03/2019 8:42 AM    [place]

## 2019-06-03 NOTE — Patient Instructions (Addendum)

## 2019-10-22 DIAGNOSIS — Z23 Encounter for immunization: Secondary | ICD-10-CM | POA: Diagnosis not present

## 2019-11-10 DIAGNOSIS — Z961 Presence of intraocular lens: Secondary | ICD-10-CM | POA: Diagnosis not present

## 2019-11-10 DIAGNOSIS — H04123 Dry eye syndrome of bilateral lacrimal glands: Secondary | ICD-10-CM | POA: Diagnosis not present

## 2019-11-10 DIAGNOSIS — H35033 Hypertensive retinopathy, bilateral: Secondary | ICD-10-CM | POA: Diagnosis not present

## 2019-11-10 DIAGNOSIS — H40013 Open angle with borderline findings, low risk, bilateral: Secondary | ICD-10-CM | POA: Diagnosis not present

## 2019-11-19 DIAGNOSIS — Z23 Encounter for immunization: Secondary | ICD-10-CM | POA: Diagnosis not present

## 2020-03-17 ENCOUNTER — Encounter: Payer: Self-pay | Admitting: *Deleted

## 2020-03-18 ENCOUNTER — Ambulatory Visit: Payer: Medicare Other | Admitting: Cardiology

## 2020-03-18 ENCOUNTER — Other Ambulatory Visit: Payer: Self-pay

## 2020-03-18 ENCOUNTER — Encounter: Payer: Self-pay | Admitting: Cardiology

## 2020-03-18 ENCOUNTER — Ambulatory Visit (INDEPENDENT_AMBULATORY_CARE_PROVIDER_SITE_OTHER): Payer: Medicare Other | Admitting: Cardiology

## 2020-03-18 VITALS — BP 130/90 | HR 75 | Ht 72.0 in | Wt 190.8 lb

## 2020-03-18 DIAGNOSIS — R001 Bradycardia, unspecified: Secondary | ICD-10-CM | POA: Diagnosis not present

## 2020-03-18 DIAGNOSIS — I4821 Permanent atrial fibrillation: Secondary | ICD-10-CM | POA: Diagnosis not present

## 2020-03-18 DIAGNOSIS — I10 Essential (primary) hypertension: Secondary | ICD-10-CM | POA: Diagnosis not present

## 2020-03-18 NOTE — Patient Instructions (Addendum)

## 2020-03-18 NOTE — Progress Notes (Signed)
Cardiology Office Note  Date: 03/18/2020   ID: Olaf, Mesa Sep 04, 1940, MRN [id number]  PCP:  Curlene Labrum, MD  Cardiologist:  Rozann Lesches, MD Electrophysiologist:  None   Chief Complaint  Patient presents with  . Cardiac follow-up    History of Present Illness: Travis Santos is an 79 y.o. male last assessed via telehealth encounter in October 2020.  He presents for a follow-up visit.  He tells me that he has had good stamina, still uses his tractor and outdoor equipment, chainsaw, enjoys being outside.  He denies any sense of sudden dizziness or syncope, no chest pain or palpitations.  CHA2DS2-VASc score is 3.  He has declined anticoagulation over time.  He is not on any AV nodal blockers and tends to run a slow heart rate although has not been clearly symptomatic.  I personally reviewed his ECG today which showed slow atrial fibrillation at 38 bpm, nonspecific ST changes.  He was entirely asymptomatic during his office visit.  We had him ambulate in the office with resting heart rate going from 57 up to 75 after a few minutes.  He did not report any symptoms.  He also reports having some left hand numbness and tingling, was referred for a brain MRI by Dr. Pleas Koch which is pending.  Certainly, if he does have evidence of stroke, this would be yet another indication for him to reconsider anticoagulation with atrial fibrillation.  Past Medical History:  Diagnosis Date  . Arthritis   . Atrial fibrillation ([place])   . Essential hypertension   . GERD (gastroesophageal reflux disease)   . Pituitary tumor    Followed at [facility]  . Prostatic hypertrophy     Past Surgical History:  Procedure Laterality Date  . COLONOSCOPY  02/07/04   Dr. Lindalou Hose: tubular adenoma  . COLONOSCOPY N/A 12/31/2014   Procedure: COLONOSCOPY;  Surgeon: Danie Binder, MD;  [facility];  Service: Endoscopy;  Laterality: N/A;  1215  . HYDROCELE EXCISION Right 08/25/2013    Procedure: HYDROCELECTOMY ADULT;  Surgeon: Irine Seal, MD;  [facility];  Service: Urology;  Laterality: Right;  . NASAL POLYP SURGERY  2007  . TRANSURETHRAL RESECTION OF PROSTATE N/A 08/25/2013   Procedure: TRANSURETHRAL RESECTION OF THE PROSTATE WITH GYRUS INSTRUMENTS;  Surgeon: Irine Seal, MD;  [facility];  Service: Urology;  Laterality: N/A;  . TUMOR REMOVAL  2006   Tumor removed from left clavical area, "softball size"  . TUMOR REMOVAL  2014   on nose, radiation treatments  . VEIN SURGERY Bilateral 10 years ago   "having trouble with phlebitis so dried up veins"    Current Outpatient Medications  Medication Sig Dispense Refill  . amLODipine (NORVASC) 5 MG tablet TAKE 1 TABLET BY MOUTH EVERY MORNING 90 tablet 0  . aspirin EC 81 MG tablet Take 81 mg by mouth daily.    . Cholecalciferol (VITAMIN D3) 2000 units TABS Take 1 tablet by mouth daily.     . Digestive Enzymes CAPS Take 1 capsule by mouth daily.     Marland Kitchen docusate sodium (COLACE) 100 MG capsule Take 100 mg by mouth daily.     Marland Kitchen lisinopril (PRINIVIL,ZESTRIL) 20 MG tablet TAKE 1 TABLET BY MOUTH EVERY MORNING 90 tablet 0  . potassium chloride (K-DUR) 10 MEQ tablet TAKE (1) TABLET BY MOUTH ONCE DAILY. 90 tablet 0   No current facility-administered medications for this visit.   Allergies:  Bee venom   ROS:  Reflux symptoms.  Physical Exam: VS:  BP (!) 130/90   Pulse 75 Comment: 5 minutes later after walking  Ht 6' (1.829 m)   Wt 190 lb 12.8 oz (86.5 kg)   SpO2 98%   BMI 25.88 kg/m , BMI Body mass index is 25.88 kg/m.  Wt Readings from Last 3 Encounters:  03/18/20 190 lb 12.8 oz (86.5 kg)  06/03/19 195 lb (88.5 kg)  03/06/18 195 lb 12.8 oz (88.8 kg)    General: Elderly male, appears comfortable at rest. HEENT: Conjunctiva and lids normal, wearing a mask. Neck: Supple, no elevated JVP or carotid bruits, no thyromegaly. Lungs: Clear to auscultation, nonlabored breathing at rest. Cardiac: Irregularly irregular,  no S3, soft systolic murmur. Abdomen: Soft, nontender, bowel sounds present. Extremities: No pitting edema, distal pulses 2+.  ECG:  An ECG dated 03/06/2018 was personally reviewed today and demonstrated:  Atrial fibrillation with nonspecific ST-T changes.  Recent Labwork:  April 2018: BUN 14, creatinine 1.4, potassium 3.7, AST 29, ALT 23, hemoglobin 14.2, platelets 161, cholesterol 135, triglycerides 65, HDL 49, LDL 73, TSH 2.9  Other Studies Reviewed Today:  Echocardiogram 03/20/2013: Study Conclusions  - Left ventricle: Mild posterior wall and moderate septal hypertrophy. Systolic function was normal. The estimated ejection fraction was in the range of 60% to 65%. Wall motion was normal; there were no regional wall motion abnormalities. The study is not technically sufficient to allow evaluation of LV diastolic function, due to underlying atrial fibrillation. - Aortic valve: Trileaflet; mildly thickened leaflets. There was no stenosis. - Mitral valve: Some papillary muscle calcification was seen. - Left atrium: The atrium was mildly dilated. - Tricuspid valve: Mild regurgitation. - Pericardium, extracardiac: A trivial pericardial effusion was identified.  Assessment and Plan:  1.  Atrial fibrillation with CHA2DS2-VASc score of 3.  He has consistently declined anticoagulation over time.  He is not on AV nodal blockers with resting bradycardia.  He reports having some left hand numbness with work-up ongoing per Dr. Pleas Koch, brain MRI pending.  Certainly, if he has more definitive evidence of recent or old stroke, this would be yet another reason for him to reconsider anticoagulation.  2.  Resting bradycardia, does not appear to be clearly symptom limiting based on discussion of his activities, but I suspect he does have chronotropic incompetence.  Heart rate went from 57 bpm at rest up to 75 bpm after a few minutes of ambulating in the hall today.  We will continue  to observe for now.  We have discussed warning signs and symptoms (sudden dizziness or syncope) that would prompt further evaluation.  I did talk with him about the possibility of a pacemaker potentially.  3.  Essential hypertension, he is on Norvasc and lisinopril with follow-up by Dr. Pleas Koch, systolic 947 today.  Medication Adjustments/Labs and Tests Ordered: Current medicines are reviewed at length with the patient today.  Concerns regarding medicines are outlined above.   Tests Ordered: Orders Placed This Encounter  Procedures  . EKG 12-Lead    Medication Changes: No orders of the defined types were placed in this encounter.   Disposition:  Follow up 6 months in the [place] office.  Signed, Satira Sark, MD, [facility] 03/18/2020 10:42 AM    [facility] [place], [place], [place] [address] Phone: [phone]; Fax: [phone]

## 2020-04-01 DIAGNOSIS — I6389 Other cerebral infarction: Secondary | ICD-10-CM | POA: Diagnosis not present

## 2020-04-01 DIAGNOSIS — G9389 Other specified disorders of brain: Secondary | ICD-10-CM | POA: Diagnosis not present

## 2020-04-01 DIAGNOSIS — E236 Other disorders of pituitary gland: Secondary | ICD-10-CM | POA: Diagnosis not present

## 2020-04-01 DIAGNOSIS — I616 Nontraumatic intracerebral hemorrhage, multiple localized: Secondary | ICD-10-CM | POA: Diagnosis not present

## 2020-04-01 DIAGNOSIS — I6782 Cerebral ischemia: Secondary | ICD-10-CM | POA: Diagnosis not present

## 2020-04-28 DIAGNOSIS — D443 Neoplasm of uncertain behavior of pituitary gland: Secondary | ICD-10-CM | POA: Diagnosis not present

## 2020-04-28 DIAGNOSIS — H40013 Open angle with borderline findings, low risk, bilateral: Secondary | ICD-10-CM | POA: Diagnosis not present

## 2020-06-07 DIAGNOSIS — I69398 Other sequelae of cerebral infarction: Secondary | ICD-10-CM | POA: Diagnosis not present

## 2020-06-07 DIAGNOSIS — R208 Other disturbances of skin sensation: Secondary | ICD-10-CM | POA: Diagnosis not present

## 2020-06-07 DIAGNOSIS — D352 Benign neoplasm of pituitary gland: Secondary | ICD-10-CM | POA: Diagnosis not present

## 2020-06-07 DIAGNOSIS — Z923 Personal history of irradiation: Secondary | ICD-10-CM | POA: Diagnosis not present

## 2020-09-18 NOTE — Progress Notes (Signed)
Cardiology Office Note  Date: 09/19/2020   ID: Travis, Santos 15-Aug-1941, MRN [id number]  PCP:  Curlene Labrum, MD  Cardiologist:  Rozann Lesches, MD Electrophysiologist:  None   Chief Complaint  Patient presents with  . Cardiac follow-up    History of Present Illness: Travis Santos is an 80 y.o. male last seen in July 2021.  He presents for a routine follow-up visit.  Since last assessment he does not describe any episodes of sudden lightheadedness or syncope.  Heart rate is higher today than at last check.  No definite change in stamina.  He has declined anticoagulation over time, CHA2DS2-VASc score is 3.  He does not report any palpitations and is not on any AV nodal blockers.  I reviewed his medications which are outlined below.  Past Medical History:  Diagnosis Date  . Arthritis   . Atrial fibrillation ([place])   . Essential hypertension   . GERD (gastroesophageal reflux disease)   . Pituitary tumor    Followed at [facility]  . Prostatic hypertrophy     Past Surgical History:  Procedure Laterality Date  . COLONOSCOPY  02/07/04   Dr. Lindalou Hose: tubular adenoma  . COLONOSCOPY N/A 12/31/2014   Procedure: COLONOSCOPY;  Surgeon: Danie Binder, MD;  Location: [facility];  Service: Endoscopy;  Laterality: N/A;  1215  . HYDROCELE EXCISION Right 08/25/2013   Procedure: HYDROCELECTOMY ADULT;  Surgeon: Irine Seal, MD;  Location: [facility];  Service: Urology;  Laterality: Right;  . NASAL POLYP SURGERY  2007  . TRANSURETHRAL RESECTION OF PROSTATE N/A 08/25/2013   Procedure: TRANSURETHRAL RESECTION OF THE PROSTATE WITH GYRUS INSTRUMENTS;  Surgeon: Irine Seal, MD;  Location: [facility];  Service: Urology;  Laterality: N/A;  . TUMOR REMOVAL  2006   Tumor removed from left clavical area, "softball size"  . TUMOR REMOVAL  2014   on nose, radiation treatments  . VEIN SURGERY Bilateral 10 years ago   "having trouble with phlebitis so dried up veins"    Current Outpatient  Medications  Medication Sig Dispense Refill  . amLODipine (NORVASC) 5 MG tablet TAKE 1 TABLET BY MOUTH EVERY MORNING 90 tablet 0  . aspirin EC 81 MG tablet Take 81 mg by mouth daily.    . Cholecalciferol (VITAMIN D3) 2000 units TABS Take 1 tablet by mouth daily.     . Digestive Enzymes CAPS Take 1 capsule by mouth daily.     Marland Kitchen docusate sodium (COLACE) 100 MG capsule Take 100 mg by mouth daily.     Marland Kitchen lisinopril (PRINIVIL,ZESTRIL) 20 MG tablet TAKE 1 TABLET BY MOUTH EVERY MORNING 90 tablet 0  . potassium chloride (K-DUR) 10 MEQ tablet TAKE (1) TABLET BY MOUTH ONCE DAILY. 90 tablet 0   No current facility-administered medications for this visit.   Allergies:  Bee venom   ROS: No syncope.  Physical Exam: VS:  BP 130/88   Pulse 67   Ht 6' (1.829 m)   Wt 190 lb (86.2 kg)   SpO2 98%   BMI 25.77 kg/m , BMI Body mass index is 25.77 kg/m.  Wt Readings from Last 3 Encounters:  09/19/20 190 lb (86.2 kg)  03/18/20 190 lb 12.8 oz (86.5 kg)  06/03/19 195 lb (88.5 kg)    General: Patient appears comfortable at rest. HEENT: Conjunctiva and lids normal, wearing a mask. Neck: Supple, no elevated JVP or carotid bruits, no thyromegaly. Lungs: Clear to auscultation, nonlabored breathing at rest. Cardiac: Irregularly irregular, no  S3, soft systolic murmur. Extremities: No pitting edema.  ECG:  An ECG dated 03/18/2020 was personally reviewed today and demonstrated:  Slow atrial fibrillation with nonspecific ST-T changes.  Recent Labwork:  April 2018: BUN 14, creatinine 1.4, potassium 3.7, AST 29, ALT 23, hemoglobin 14.2, platelets 161, cholesterol 135, triglycerides 65, HDL 49, LDL 73, TSH 2.9  Other Studies Reviewed Today:  Brain MRI 04/01/2020 [facility]): 1. No acute intracranial abnormality.  2. 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 This is stable by report  since a Brain MRI in 2019.  3. Chronic small vessel  ischemic disease is stable.   Pituitary MRI 06/07/2020 [facility]): 1. Compared to 03/10/2015 MRI, interval decrease in the size of patient's known pituitary macroadenoma with improvement in mass effect on the optic chiasm, presumably due to interval treatment.   2. Mild T2 hyperintense signal in the right aspect of the optic chiasm likely secondary to edema/gliosis related to prior tumor compression of the chiasm.  3. No extension of mass into left cavernous sinus. No evidence of extension of mass beyond the lateral wall of right cavernous ICA.  Assessment and Plan:  1.  Permanent atrial fibrillation with CHA2DS2-VASc score of at least 3.  He has a slow heart rate at baseline, not clearly symptomatic at this point however.  He is not on any AV nodal blockers and has furthermore declined anticoagulation over time.  Continue observation.  2.  Essential hypertension, blood pressure control is adequate today on Norvasc and lisinopril.  No changes were made.  Medication Adjustments/Labs and Tests Ordered: Current medicines are reviewed at length with the patient today.  Concerns regarding medicines are outlined above.   Tests Ordered: No orders of the defined types were placed in this encounter.   Medication Changes: No orders of the defined types were placed in this encounter.   Disposition:  Follow up 6 months in the [place] office.  Signed, Satira Sark, MD, [facility] 09/19/2020 10:14 AM    [facility] [place], [place], [place] [address] Phone: [phone]; Fax: [phone]

## 2020-09-19 ENCOUNTER — Encounter: Payer: Self-pay | Admitting: Cardiology

## 2020-09-19 ENCOUNTER — Ambulatory Visit (INDEPENDENT_AMBULATORY_CARE_PROVIDER_SITE_OTHER): Payer: Medicare Other | Admitting: Cardiology

## 2020-09-19 ENCOUNTER — Other Ambulatory Visit: Payer: Self-pay

## 2020-09-19 VITALS — BP 130/88 | HR 67 | Ht 72.0 in | Wt 190.0 lb

## 2020-09-19 DIAGNOSIS — I10 Essential (primary) hypertension: Secondary | ICD-10-CM

## 2020-09-19 DIAGNOSIS — I4821 Permanent atrial fibrillation: Secondary | ICD-10-CM | POA: Diagnosis not present

## 2020-09-19 NOTE — Patient Instructions (Addendum)

## 2020-12-16 DIAGNOSIS — H35033 Hypertensive retinopathy, bilateral: Secondary | ICD-10-CM | POA: Diagnosis not present

## 2020-12-16 DIAGNOSIS — H40013 Open angle with borderline findings, low risk, bilateral: Secondary | ICD-10-CM | POA: Diagnosis not present

## 2020-12-16 DIAGNOSIS — H04123 Dry eye syndrome of bilateral lacrimal glands: Secondary | ICD-10-CM | POA: Diagnosis not present

## 2020-12-16 DIAGNOSIS — D443 Neoplasm of uncertain behavior of pituitary gland: Secondary | ICD-10-CM | POA: Diagnosis not present

## 2021-05-08 DIAGNOSIS — E559 Vitamin D deficiency, unspecified: Secondary | ICD-10-CM | POA: Diagnosis not present

## 2021-05-08 DIAGNOSIS — Z1329 Encounter for screening for other suspected endocrine disorder: Secondary | ICD-10-CM | POA: Diagnosis not present

## 2021-05-08 DIAGNOSIS — I10 Essential (primary) hypertension: Secondary | ICD-10-CM | POA: Diagnosis not present

## 2021-05-08 DIAGNOSIS — E782 Mixed hyperlipidemia: Secondary | ICD-10-CM | POA: Diagnosis not present

## 2021-05-08 DIAGNOSIS — N183 Chronic kidney disease, stage 3 unspecified: Secondary | ICD-10-CM | POA: Diagnosis not present

## 2021-05-08 DIAGNOSIS — E7849 Other hyperlipidemia: Secondary | ICD-10-CM | POA: Diagnosis not present

## 2021-05-10 DIAGNOSIS — I639 Cerebral infarction, unspecified: Secondary | ICD-10-CM | POA: Diagnosis not present

## 2021-05-10 DIAGNOSIS — I10 Essential (primary) hypertension: Secondary | ICD-10-CM | POA: Diagnosis not present

## 2021-05-10 DIAGNOSIS — E236 Other disorders of pituitary gland: Secondary | ICD-10-CM | POA: Diagnosis not present

## 2021-05-10 DIAGNOSIS — I4821 Permanent atrial fibrillation: Secondary | ICD-10-CM | POA: Diagnosis not present

## 2021-05-10 DIAGNOSIS — R7989 Other specified abnormal findings of blood chemistry: Secondary | ICD-10-CM | POA: Diagnosis not present

## 2021-05-10 DIAGNOSIS — Z23 Encounter for immunization: Secondary | ICD-10-CM | POA: Diagnosis not present

## 2021-05-10 DIAGNOSIS — E7849 Other hyperlipidemia: Secondary | ICD-10-CM | POA: Diagnosis not present

## 2021-05-10 DIAGNOSIS — G5692 Unspecified mononeuropathy of left upper limb: Secondary | ICD-10-CM | POA: Diagnosis not present

## 2021-06-30 DIAGNOSIS — H40013 Open angle with borderline findings, low risk, bilateral: Secondary | ICD-10-CM | POA: Diagnosis not present

## 2021-06-30 DIAGNOSIS — D443 Neoplasm of uncertain behavior of pituitary gland: Secondary | ICD-10-CM | POA: Diagnosis not present

## 2021-11-01 DIAGNOSIS — E782 Mixed hyperlipidemia: Secondary | ICD-10-CM | POA: Diagnosis not present

## 2021-11-01 DIAGNOSIS — I10 Essential (primary) hypertension: Secondary | ICD-10-CM | POA: Diagnosis not present

## 2021-11-01 DIAGNOSIS — R7989 Other specified abnormal findings of blood chemistry: Secondary | ICD-10-CM | POA: Diagnosis not present

## 2021-11-01 DIAGNOSIS — E7849 Other hyperlipidemia: Secondary | ICD-10-CM | POA: Diagnosis not present

## 2021-11-01 DIAGNOSIS — N183 Chronic kidney disease, stage 3 unspecified: Secondary | ICD-10-CM | POA: Diagnosis not present

## 2021-11-08 DIAGNOSIS — I10 Essential (primary) hypertension: Secondary | ICD-10-CM | POA: Diagnosis not present

## 2021-11-08 DIAGNOSIS — Z1331 Encounter for screening for depression: Secondary | ICD-10-CM | POA: Diagnosis not present

## 2021-11-08 DIAGNOSIS — Z0001 Encounter for general adult medical examination with abnormal findings: Secondary | ICD-10-CM | POA: Diagnosis not present

## 2021-11-08 DIAGNOSIS — Z1389 Encounter for screening for other disorder: Secondary | ICD-10-CM | POA: Diagnosis not present

## 2021-11-08 DIAGNOSIS — M4802 Spinal stenosis, cervical region: Secondary | ICD-10-CM | POA: Diagnosis not present

## 2021-11-08 DIAGNOSIS — Z23 Encounter for immunization: Secondary | ICD-10-CM | POA: Diagnosis not present

## 2021-11-08 DIAGNOSIS — I4821 Permanent atrial fibrillation: Secondary | ICD-10-CM | POA: Diagnosis not present

## 2021-11-08 DIAGNOSIS — E7849 Other hyperlipidemia: Secondary | ICD-10-CM | POA: Diagnosis not present

## 2021-11-22 DIAGNOSIS — M79642 Pain in left hand: Secondary | ICD-10-CM | POA: Diagnosis not present

## 2021-11-22 DIAGNOSIS — M180 Bilateral primary osteoarthritis of first carpometacarpal joints: Secondary | ICD-10-CM | POA: Diagnosis not present

## 2021-11-22 DIAGNOSIS — M79641 Pain in right hand: Secondary | ICD-10-CM | POA: Diagnosis not present

## 2021-11-22 DIAGNOSIS — G5603 Carpal tunnel syndrome, bilateral upper limbs: Secondary | ICD-10-CM | POA: Diagnosis not present

## 2021-11-22 DIAGNOSIS — M5412 Radiculopathy, cervical region: Secondary | ICD-10-CM | POA: Diagnosis not present

## 2021-12-21 DIAGNOSIS — M5412 Radiculopathy, cervical region: Secondary | ICD-10-CM | POA: Diagnosis not present

## 2021-12-25 DIAGNOSIS — G5603 Carpal tunnel syndrome, bilateral upper limbs: Secondary | ICD-10-CM | POA: Diagnosis not present

## 2021-12-25 DIAGNOSIS — G5621 Lesion of ulnar nerve, right upper limb: Secondary | ICD-10-CM | POA: Diagnosis not present

## 2022-01-25 DIAGNOSIS — H35013 Changes in retinal vascular appearance, bilateral: Secondary | ICD-10-CM | POA: Diagnosis not present

## 2022-01-25 DIAGNOSIS — H35363 Drusen (degenerative) of macula, bilateral: Secondary | ICD-10-CM | POA: Diagnosis not present

## 2022-01-25 DIAGNOSIS — H35033 Hypertensive retinopathy, bilateral: Secondary | ICD-10-CM | POA: Diagnosis not present

## 2022-01-25 DIAGNOSIS — H40013 Open angle with borderline findings, low risk, bilateral: Secondary | ICD-10-CM | POA: Diagnosis not present

## 2022-05-03 DIAGNOSIS — E559 Vitamin D deficiency, unspecified: Secondary | ICD-10-CM | POA: Diagnosis not present

## 2022-05-03 DIAGNOSIS — N183 Chronic kidney disease, stage 3 unspecified: Secondary | ICD-10-CM | POA: Diagnosis not present

## 2022-05-03 DIAGNOSIS — E039 Hypothyroidism, unspecified: Secondary | ICD-10-CM | POA: Diagnosis not present

## 2022-05-03 DIAGNOSIS — I10 Essential (primary) hypertension: Secondary | ICD-10-CM | POA: Diagnosis not present

## 2022-05-03 DIAGNOSIS — E7849 Other hyperlipidemia: Secondary | ICD-10-CM | POA: Diagnosis not present

## 2022-05-14 DIAGNOSIS — E7849 Other hyperlipidemia: Secondary | ICD-10-CM | POA: Diagnosis not present

## 2022-05-14 DIAGNOSIS — E559 Vitamin D deficiency, unspecified: Secondary | ICD-10-CM | POA: Diagnosis not present

## 2022-05-14 DIAGNOSIS — Z6824 Body mass index (BMI) 24.0-24.9, adult: Secondary | ICD-10-CM | POA: Diagnosis not present

## 2022-05-14 DIAGNOSIS — I10 Essential (primary) hypertension: Secondary | ICD-10-CM | POA: Diagnosis not present

## 2022-05-14 DIAGNOSIS — Z23 Encounter for immunization: Secondary | ICD-10-CM | POA: Diagnosis not present

## 2022-05-14 DIAGNOSIS — M4802 Spinal stenosis, cervical region: Secondary | ICD-10-CM | POA: Diagnosis not present

## 2022-05-14 DIAGNOSIS — I4821 Permanent atrial fibrillation: Secondary | ICD-10-CM | POA: Diagnosis not present

## 2022-05-14 DIAGNOSIS — G5692 Unspecified mononeuropathy of left upper limb: Secondary | ICD-10-CM | POA: Diagnosis not present

## 2022-07-05 DIAGNOSIS — E785 Hyperlipidemia, unspecified: Secondary | ICD-10-CM | POA: Diagnosis not present

## 2022-07-05 DIAGNOSIS — I10 Essential (primary) hypertension: Secondary | ICD-10-CM | POA: Diagnosis not present

## 2022-07-05 DIAGNOSIS — T18108A Unspecified foreign body in esophagus causing other injury, initial encounter: Secondary | ICD-10-CM | POA: Diagnosis not present

## 2022-08-09 DIAGNOSIS — D443 Neoplasm of uncertain behavior of pituitary gland: Secondary | ICD-10-CM | POA: Diagnosis not present

## 2022-08-09 DIAGNOSIS — H40013 Open angle with borderline findings, low risk, bilateral: Secondary | ICD-10-CM | POA: Diagnosis not present

## 2022-10-25 DIAGNOSIS — I10 Essential (primary) hypertension: Secondary | ICD-10-CM | POA: Diagnosis not present

## 2022-10-25 DIAGNOSIS — Z0001 Encounter for general adult medical examination with abnormal findings: Secondary | ICD-10-CM | POA: Diagnosis not present

## 2022-10-25 DIAGNOSIS — E7849 Other hyperlipidemia: Secondary | ICD-10-CM | POA: Diagnosis not present

## 2022-10-25 DIAGNOSIS — N183 Chronic kidney disease, stage 3 unspecified: Secondary | ICD-10-CM | POA: Diagnosis not present

## 2022-10-30 DIAGNOSIS — E7849 Other hyperlipidemia: Secondary | ICD-10-CM | POA: Diagnosis not present

## 2022-10-30 DIAGNOSIS — G5692 Unspecified mononeuropathy of left upper limb: Secondary | ICD-10-CM | POA: Diagnosis not present

## 2022-10-30 DIAGNOSIS — M4802 Spinal stenosis, cervical region: Secondary | ICD-10-CM | POA: Diagnosis not present

## 2022-10-30 DIAGNOSIS — I10 Essential (primary) hypertension: Secondary | ICD-10-CM | POA: Diagnosis not present

## 2022-10-30 DIAGNOSIS — E559 Vitamin D deficiency, unspecified: Secondary | ICD-10-CM | POA: Diagnosis not present

## 2022-10-30 DIAGNOSIS — Z6824 Body mass index (BMI) 24.0-24.9, adult: Secondary | ICD-10-CM | POA: Diagnosis not present

## 2022-10-30 DIAGNOSIS — Z0001 Encounter for general adult medical examination with abnormal findings: Secondary | ICD-10-CM | POA: Diagnosis not present

## 2022-10-30 DIAGNOSIS — N1832 Chronic kidney disease, stage 3b: Secondary | ICD-10-CM | POA: Diagnosis not present

## 2022-10-30 DIAGNOSIS — Z23 Encounter for immunization: Secondary | ICD-10-CM | POA: Diagnosis not present

## 2022-10-30 DIAGNOSIS — I4821 Permanent atrial fibrillation: Secondary | ICD-10-CM | POA: Diagnosis not present

## 2023-02-07 DIAGNOSIS — D443 Neoplasm of uncertain behavior of pituitary gland: Secondary | ICD-10-CM | POA: Diagnosis not present

## 2023-02-07 DIAGNOSIS — H1045 Other chronic allergic conjunctivitis: Secondary | ICD-10-CM | POA: Diagnosis not present

## 2023-02-07 DIAGNOSIS — H04123 Dry eye syndrome of bilateral lacrimal glands: Secondary | ICD-10-CM | POA: Diagnosis not present

## 2023-02-07 DIAGNOSIS — H40013 Open angle with borderline findings, low risk, bilateral: Secondary | ICD-10-CM | POA: Diagnosis not present

## 2023-04-23 DIAGNOSIS — R7989 Other specified abnormal findings of blood chemistry: Secondary | ICD-10-CM | POA: Diagnosis not present

## 2023-04-23 DIAGNOSIS — E559 Vitamin D deficiency, unspecified: Secondary | ICD-10-CM | POA: Diagnosis not present

## 2023-04-23 DIAGNOSIS — E7849 Other hyperlipidemia: Secondary | ICD-10-CM | POA: Diagnosis not present

## 2023-04-23 DIAGNOSIS — N1832 Chronic kidney disease, stage 3b: Secondary | ICD-10-CM | POA: Diagnosis not present

## 2023-04-23 DIAGNOSIS — I10 Essential (primary) hypertension: Secondary | ICD-10-CM | POA: Diagnosis not present

## 2023-04-30 DIAGNOSIS — N1831 Chronic kidney disease, stage 3a: Secondary | ICD-10-CM | POA: Diagnosis not present

## 2023-04-30 DIAGNOSIS — I4821 Permanent atrial fibrillation: Secondary | ICD-10-CM | POA: Diagnosis not present

## 2023-04-30 DIAGNOSIS — E7849 Other hyperlipidemia: Secondary | ICD-10-CM | POA: Diagnosis not present

## 2023-04-30 DIAGNOSIS — M4802 Spinal stenosis, cervical region: Secondary | ICD-10-CM | POA: Diagnosis not present

## 2023-04-30 DIAGNOSIS — G5692 Unspecified mononeuropathy of left upper limb: Secondary | ICD-10-CM | POA: Diagnosis not present

## 2023-04-30 DIAGNOSIS — Z23 Encounter for immunization: Secondary | ICD-10-CM | POA: Diagnosis not present

## 2023-04-30 DIAGNOSIS — Z6824 Body mass index (BMI) 24.0-24.9, adult: Secondary | ICD-10-CM | POA: Diagnosis not present

## 2023-04-30 DIAGNOSIS — I10 Essential (primary) hypertension: Secondary | ICD-10-CM | POA: Diagnosis not present

## 2023-08-14 DIAGNOSIS — H40013 Open angle with borderline findings, low risk, bilateral: Secondary | ICD-10-CM | POA: Diagnosis not present

## 2023-08-14 DIAGNOSIS — D443 Neoplasm of uncertain behavior of pituitary gland: Secondary | ICD-10-CM | POA: Diagnosis not present

## 2023-09-16 ENCOUNTER — Encounter: Payer: Self-pay | Admitting: Cardiology

## 2023-09-16 ENCOUNTER — Ambulatory Visit: Payer: Medicare Other | Attending: Cardiology | Admitting: Cardiology

## 2023-09-16 VITALS — BP 136/76 | HR 41 | Ht 72.0 in | Wt 175.3 lb

## 2023-09-16 DIAGNOSIS — I4821 Permanent atrial fibrillation: Secondary | ICD-10-CM | POA: Insufficient documentation

## 2023-09-16 DIAGNOSIS — I10 Essential (primary) hypertension: Secondary | ICD-10-CM | POA: Insufficient documentation

## 2023-09-16 NOTE — Patient Instructions (Signed)
Medication Instructions:  Continue all current medications.   Labwork: none  Testing/Procedures: none  Follow-Up: 6 months   Any Other Special Instructions Will Be Listed Below (If Applicable).   If you need a refill on your cardiac medications before your next appointment, please call your pharmacy.  

## 2023-09-16 NOTE — Progress Notes (Signed)
    Cardiology Office Note  Date: 09/16/2023   ID: Charlis, Stuchell 1940/10/14, MRN [id number]  History of Present Illness: Travis Santos is an 83 y.o. male last seen in January 2022.  He presents overdue for a routine visit.  Reports no specific change in stamina, no reproducible exertional chest pain, no sudden dizziness or syncope.  He remains active with ADLs and continues to follow at [facility].  I reviewed his medications.  He remains on aspirin, Norvasc, Lipitor, and lisinopril.  Not on any AV nodal blockers.  I reviewed his ECG today which shows slow atrial fibrillation with nonspecific ST-T changes, 41 bpm.  Physical Exam: VS:  BP 136/76   Pulse (!) 41   Ht 6' (1.829 m)   Wt 175 lb 4.8 oz (79.5 kg)   SpO2 99%   BMI 23.77 kg/m , BMI Body mass index is 23.77 kg/m.  Wt Readings from Last 3 Encounters:  09/16/23 175 lb 4.8 oz (79.5 kg)  09/19/20 190 lb (86.2 kg)  03/18/20 190 lb 12.8 oz (86.5 kg)    General: Patient appears comfortable at rest. HEENT: Conjunctiva and lids normal. Neck: Supple, no elevated JVP or carotid bruits. Lungs: Clear to auscultation, nonlabored breathing at rest. Cardiac: Irregularly irregular, 1/6 systolic murmur, no gallop.. Extremities: No pitting edema.  ECG:  An ECG dated 03/18/2020 was personally reviewed today and demonstrated:  Slow atrial fibrillation with nonspecific T wave changes.  Labwork:  March 2023: BUN 19, creatinine 1.49, potassium 4.4, GFR 47, AST 32, ALT 25, cholesterol 127, triglycerides 82, HDL 48, LDL 63, TSH 5.02  Other Studies Reviewed Today:  No interval cardiac testing for review today.  Assessment and Plan:  1.  Permanent atrial fibrillation with CHA2DS2-VASc score of 3.  He has declined anticoagulation.  At this point remains on aspirin.  He has evidence of conduction system disease with slow heart rate, although not clearly symptomatic.  We have discussed warning signs and symptoms that would prompt  further evaluation for pacemaker.  2.  Primary hypertension.  Continues on lisinopril and Norvasc with follow-up at [facility].  Disposition:  Follow up  6 months.  Signed, Jonelle Sidle, M.D., F.A.C.C. [facility] at [facility]

## 2023-11-04 DIAGNOSIS — Z1329 Encounter for screening for other suspected endocrine disorder: Secondary | ICD-10-CM | POA: Diagnosis not present

## 2023-11-04 DIAGNOSIS — E782 Mixed hyperlipidemia: Secondary | ICD-10-CM | POA: Diagnosis not present

## 2023-11-04 DIAGNOSIS — E7849 Other hyperlipidemia: Secondary | ICD-10-CM | POA: Diagnosis not present

## 2023-11-04 DIAGNOSIS — Z0001 Encounter for general adult medical examination with abnormal findings: Secondary | ICD-10-CM | POA: Diagnosis not present

## 2023-11-04 DIAGNOSIS — N183 Chronic kidney disease, stage 3 unspecified: Secondary | ICD-10-CM | POA: Diagnosis not present

## 2023-12-11 DIAGNOSIS — Z2911 Encounter for prophylactic immunotherapy for respiratory syncytial virus (RSV): Secondary | ICD-10-CM | POA: Diagnosis not present

## 2023-12-11 DIAGNOSIS — Z23 Encounter for immunization: Secondary | ICD-10-CM | POA: Diagnosis not present

## 2023-12-11 DIAGNOSIS — M4802 Spinal stenosis, cervical region: Secondary | ICD-10-CM | POA: Diagnosis not present

## 2023-12-11 DIAGNOSIS — E782 Mixed hyperlipidemia: Secondary | ICD-10-CM | POA: Diagnosis not present

## 2023-12-11 DIAGNOSIS — N1831 Chronic kidney disease, stage 3a: Secondary | ICD-10-CM | POA: Diagnosis not present

## 2023-12-11 DIAGNOSIS — Z6824 Body mass index (BMI) 24.0-24.9, adult: Secondary | ICD-10-CM | POA: Diagnosis not present

## 2023-12-11 DIAGNOSIS — Z0001 Encounter for general adult medical examination with abnormal findings: Secondary | ICD-10-CM | POA: Diagnosis not present

## 2023-12-11 DIAGNOSIS — Z1389 Encounter for screening for other disorder: Secondary | ICD-10-CM | POA: Diagnosis not present

## 2023-12-11 DIAGNOSIS — Z1331 Encounter for screening for depression: Secondary | ICD-10-CM | POA: Diagnosis not present

## 2023-12-11 DIAGNOSIS — I4821 Permanent atrial fibrillation: Secondary | ICD-10-CM | POA: Diagnosis not present

## 2024-02-19 DIAGNOSIS — D443 Neoplasm of uncertain behavior of pituitary gland: Secondary | ICD-10-CM | POA: Diagnosis not present

## 2024-02-19 DIAGNOSIS — H04123 Dry eye syndrome of bilateral lacrimal glands: Secondary | ICD-10-CM | POA: Diagnosis not present

## 2024-02-19 DIAGNOSIS — H35363 Drusen (degenerative) of macula, bilateral: Secondary | ICD-10-CM | POA: Diagnosis not present

## 2024-02-19 DIAGNOSIS — H40013 Open angle with borderline findings, low risk, bilateral: Secondary | ICD-10-CM | POA: Diagnosis not present

## 2024-05-26 DIAGNOSIS — N1831 Chronic kidney disease, stage 3a: Secondary | ICD-10-CM | POA: Diagnosis not present

## 2024-05-26 DIAGNOSIS — Z1389 Encounter for screening for other disorder: Secondary | ICD-10-CM | POA: Diagnosis not present

## 2024-05-26 DIAGNOSIS — E7849 Other hyperlipidemia: Secondary | ICD-10-CM | POA: Diagnosis not present

## 2024-06-02 ENCOUNTER — Ambulatory Visit: Attending: Cardiology | Admitting: Cardiology

## 2024-06-02 ENCOUNTER — Telehealth: Payer: Self-pay | Admitting: Cardiology

## 2024-06-02 ENCOUNTER — Other Ambulatory Visit (HOSPITAL_BASED_OUTPATIENT_CLINIC_OR_DEPARTMENT_OTHER): Payer: Self-pay

## 2024-06-02 ENCOUNTER — Encounter: Payer: Self-pay | Admitting: Cardiology

## 2024-06-02 ENCOUNTER — Ambulatory Visit

## 2024-06-02 VITALS — BP 148/72 | HR 50 | Ht 72.0 in | Wt 181.8 lb

## 2024-06-02 DIAGNOSIS — N1831 Chronic kidney disease, stage 3a: Secondary | ICD-10-CM | POA: Diagnosis not present

## 2024-06-02 DIAGNOSIS — E782 Mixed hyperlipidemia: Secondary | ICD-10-CM | POA: Diagnosis not present

## 2024-06-02 DIAGNOSIS — Z23 Encounter for immunization: Secondary | ICD-10-CM | POA: Diagnosis not present

## 2024-06-02 DIAGNOSIS — M4802 Spinal stenosis, cervical region: Secondary | ICD-10-CM | POA: Diagnosis not present

## 2024-06-02 DIAGNOSIS — I10 Essential (primary) hypertension: Secondary | ICD-10-CM | POA: Insufficient documentation

## 2024-06-02 DIAGNOSIS — I4821 Permanent atrial fibrillation: Secondary | ICD-10-CM | POA: Insufficient documentation

## 2024-06-02 DIAGNOSIS — R001 Bradycardia, unspecified: Secondary | ICD-10-CM

## 2024-06-02 DIAGNOSIS — Z6824 Body mass index (BMI) 24.0-24.9, adult: Secondary | ICD-10-CM | POA: Diagnosis not present

## 2024-06-02 MED ORDER — APIXABAN 2.5 MG PO TABS
2.5000 mg | ORAL_TABLET | Freq: Two times a day (BID) | ORAL | 6 refills | Status: AC
  Filled 2024-06-02: qty 60, 30d supply, fill #0

## 2024-06-02 NOTE — Telephone Encounter (Signed)
 Checking percert on the following   72 hour ZIO XT dx: bradycardia & A-fib

## 2024-06-02 NOTE — Progress Notes (Signed)
      Cardiology Office Note  Date: 06/02/2024   ID: Adolphus, Hanf [birth year], MRN [id number]  History of Present Illness: Travis Santos is an 83 y.o. male last seen in January.  He is here for a routine visit.  He saw Dr. Lari earlier today.  He does not report any dizziness or syncope, no exertional chest pain.  Does state that he takes naps sometimes during the daytime, but otherwise does not report any specific change in stamina with ADLs including light yard work.  Based on description he reports generally NYHA class II dyspnea.  No orthopnea or PND.  We went over his medications and again discussed stroke prophylaxis with atrial fibrillation.  He has been on aspirin long-term, but is now in agreement to consider DOAC.  I reviewed his lab work.  We also discussed his bradycardia, heart rate is in the 40s to 50s and atrial fibrillation.  He is not on any AV nodal blockers.  Plan to get a ZIO monitor for now.  Physical Exam: VS:  BP (!) 148/72 (BP Location: Left Arm)   Pulse (!) 50   Ht 6' (1.829 m)   Wt 181 lb 12.8 oz (82.5 kg)   SpO2 98%   BMI 24.66 kg/m , BMI Body mass index is 24.66 kg/m.  Wt Readings from Last 3 Encounters:  06/02/24 181 lb 12.8 oz (82.5 kg)  09/16/23 175 lb 4.8 oz (79.5 kg)  09/19/20 190 lb (86.2 kg)    General: Patient appears comfortable at rest. HEENT: Conjunctiva and lids normal. Neck: Supple, no elevated JVP or carotid bruits. Lungs: Clear to auscultation, nonlabored breathing at rest. Cardiac: Irregularly irregular, 1/6 systolic murmur without gallop. Extremities: No pitting edema.  ECG:  An ECG dated 09/16/2023 was personally reviewed today and demonstrated:  Slow atrial fibrillation with nonspecific ST-T changes, 41 bpm.  Labwork:  March 2023: BUN 19, creatinine 1.49, GFR 47, potassium 4.4, AST 32, ALT 25, cholesterol 127, triglycerides 82, HDL 48, LDL 63, TSH 5.28 April 2024: Hgb 14.9, platelets 199, potassium 4.1, BUN 19,  creatinine 1.52, GFR 45, cholesterol 136, TG 87, HDL 56, LDL 63  Other Studies Reviewed Today:  No interval cardiac testing for review today.  Assessment and Plan:  1.  Permanent atrial fibrillation with CHA2DS2-VASc score of 3.  Slow ventricular response with heart rate in the 40s to 50s in the absence of AV nodal blockers reflective of conduction system disease.  He does not report any dizziness or frank syncope, does not delineate any specific change in stamina as discussed above.  We went over indications for stroke prophylaxis again today, and he is in agreement to switch from aspirin to Eliquis 2.5 mg twice daily (creatinine 1.52 at age 59).  We will obtain a 72-hour ZIO monitor to better investigate heart rate variability and determine if EP consultation should be considered.   2.  Primary hypertension.  Continue Norvasc  5 mg daily and lisinopril  20 mg daily.  Disposition:  Follow up test results.  Signed, Jayson JUDITHANN Sierras, M.D., F.A.C.C. [facility] at [facility]

## 2024-06-02 NOTE — Patient Instructions (Addendum)
 Medication Instructions:  Your physician has recommended you make the following change in your medication:  Stop aspirin. Start eliquis 2.5 mg twice daily Continue all other medications as prescribed  Labwork: none  Testing/Procedures: ZIO XT heart monitor (3) days.   Follow-Up: Your physician recommends that you schedule a follow-up appointment in: pending  Any Other Special Instructions Will Be Listed Below (If Applicable).  If you need a refill on your cardiac medications before your next appointment, please call your pharmacy.

## 2024-06-10 DIAGNOSIS — R001 Bradycardia, unspecified: Secondary | ICD-10-CM | POA: Diagnosis not present

## 2024-06-10 DIAGNOSIS — I4821 Permanent atrial fibrillation: Secondary | ICD-10-CM | POA: Diagnosis not present

## 2024-06-11 ENCOUNTER — Telehealth: Payer: Self-pay | Admitting: Cardiology

## 2024-06-11 ENCOUNTER — Ambulatory Visit: Payer: Self-pay | Admitting: Cardiology

## 2024-06-11 DIAGNOSIS — R001 Bradycardia, unspecified: Secondary | ICD-10-CM

## 2024-06-11 DIAGNOSIS — I4821 Permanent atrial fibrillation: Secondary | ICD-10-CM

## 2024-06-11 NOTE — Telephone Encounter (Signed)
 Spoke with Olivia from [facility] at time of incoming call - she reported slow afib with HR of 31 bpm lasting 60 seconds on page 8-9, strip # 5 - noted on 06/04/24 at 3:10 am.  States they do not call patients with XT monitors.    Monitor results are on provider desktop - studies to read .

## 2024-06-11 NOTE — Telephone Encounter (Signed)
 Caller Elta) is reporting abnormal results.

## 2024-06-23 ENCOUNTER — Telehealth: Payer: Self-pay | Admitting: Cardiology

## 2024-06-23 ENCOUNTER — Encounter: Payer: Self-pay | Admitting: *Deleted

## 2024-06-23 NOTE — Telephone Encounter (Signed)
 Checking percert on the following patient for testing scheduled at [facility].    ETT 06/25/2024

## 2024-06-25 ENCOUNTER — Ambulatory Visit (HOSPITAL_COMMUNITY)
Admission: RE | Admit: 2024-06-25 | Discharge: 2024-06-25 | Disposition: A | Discharge status: Home / Self Care | Source: Ambulatory Visit | Attending: Cardiology | Admitting: Cardiology

## 2024-06-25 ENCOUNTER — Telehealth: Payer: Self-pay | Admitting: Student

## 2024-06-25 DIAGNOSIS — I4821 Permanent atrial fibrillation: Secondary | ICD-10-CM | POA: Diagnosis not present

## 2024-06-25 DIAGNOSIS — R001 Bradycardia, unspecified: Secondary | ICD-10-CM | POA: Diagnosis not present

## 2024-06-25 LAB — EXERCISE TOLERANCE TEST
Angina Index: 0
Base ST Depression (mm): 0 mm
Estimated workload: 4.6
Exercise duration (min): 4 min
Exercise duration (sec): 16 s
MPHR: 136 {beats}/min
Peak HR: 116 {beats}/min
Percent HR: 85 %
RPE: 17
Rest HR: 39 {beats}/min

## 2024-06-25 NOTE — Telephone Encounter (Signed)
     Silver LELON Rummer presented for a treadmill stress test (GXT) today. I Laymon CHRISTELLA Qua, PA-C, provided direct supervision and was present during the study today, which was completed without significant symptoms, immediate complications, or acute ST/T changes on ECG.  Official results are pending at this time.  Preliminary ECG findings may be listed in the chart, but the stress test result will not be finalized until read by the Cardiologist.  Laymon CHRISTELLA Qua, PA-C  06/25/2024, 9:28 AM

## 2024-06-26 ENCOUNTER — Ambulatory Visit: Payer: Self-pay | Admitting: Cardiology

## 2024-07-20 ENCOUNTER — Other Ambulatory Visit (HOSPITAL_BASED_OUTPATIENT_CLINIC_OR_DEPARTMENT_OTHER): Payer: Self-pay

## 2024-09-16 ENCOUNTER — Ambulatory Visit: Attending: Cardiology | Admitting: Cardiology

## 2024-09-16 ENCOUNTER — Encounter: Payer: Self-pay | Admitting: Cardiology

## 2024-09-16 VITALS — BP 140/72 | HR 58 | Ht 72.0 in | Wt 180.6 lb

## 2024-09-16 DIAGNOSIS — I4821 Permanent atrial fibrillation: Secondary | ICD-10-CM | POA: Diagnosis not present

## 2024-09-16 DIAGNOSIS — I459 Conduction disorder, unspecified: Secondary | ICD-10-CM | POA: Insufficient documentation

## 2024-09-16 DIAGNOSIS — R001 Bradycardia, unspecified: Secondary | ICD-10-CM | POA: Diagnosis not present

## 2024-09-16 DIAGNOSIS — I10 Essential (primary) hypertension: Secondary | ICD-10-CM | POA: Diagnosis present

## 2024-09-16 NOTE — Progress Notes (Signed)
 "    Cardiology Office Note  Date: 09/16/2024   ID: Travis, Santos 09-Nov-1940, MRN [id number]  History of Present Illness: Travis Santos is an 84 y.o. male last seen in October 2025.  He is here for a routine visit.  He does not report any sudden dizziness or syncope, continues to remain functional with ADLs around his house.  No exertional chest pain.  We went over his medications.  He tolerated the switch from aspirin to Eliquis , otherwise no changes in his baseline medications.  I reviewed his ECG today which shows atrial fibrillation at 58 bpm with left anterior fascicular block and nonspecific T wave changes.  We went over the results of his cardiac monitor and GXT from October 2025 as detailed below.  Still plan on formal EP consultation.  Physical Exam: VS:  BP (!) 140/72 (BP Location: Left Arm)   Pulse (!) 58   Ht 6' (1.829 m)   Wt 180 lb 9.6 oz (81.9 kg)   SpO2 97%   BMI 24.49 kg/m , BMI Body mass index is 24.49 kg/m.  Wt Readings from Last 3 Encounters:  09/16/24 180 lb 9.6 oz (81.9 kg)  06/02/24 181 lb 12.8 oz (82.5 kg)  09/16/23 175 lb 4.8 oz (79.5 kg)    General: Patient appears comfortable at rest. HEENT: Conjunctiva and lids normal. Neck: Supple, no elevated JVP or carotid bruits. Lungs: Clear to auscultation, nonlabored breathing at rest. Cardiac: Irregularly irregular, 1/6 stock murmur. Extremities: No pitting edema.  ECG:  An ECG dated 09/16/2023 was personally reviewed today and demonstrated:  Atrial fibrillation at 41 bpm with nonspecific ST-T changes.  Labwork: No results found for requested labs within last 365 days.     Component Value Date/Time   CHOL 135 06/29/2014 1619   TRIG 96 06/29/2014 1619   HDL 47 06/29/2014 1619   CHOLHDL 2.9 06/29/2014 1619   VLDL 19 06/29/2014 1619   LDLCALC 69 06/29/2014 1619   Other Studies Reviewed Today:  Cardiac monitor October 2025: ZIO monitor reviewed.  3 days, 1 hour analyzed.   Predominant  rhythm is atrial fibrillation with heart rate ranging from 27 bpm up to 99 bpm and average heart rate 46 bpm. There were rare PVCs representing less than 1% total beats.  Also limited episodes of ventricular bigeminy and trigeminy. Multiple pauses were noted (39) the longest of which was 4.2 seconds and occurred at 2:54 AM.  Preponderance of pauses occurred during early morning hours although not exclusively.  A 3.7-second pause occurred at 10:01 AM.  GXT 06/25/2024:   ETT to assess for chronotropic incompetence   Patient exercised for 4 minutes and 16 seconds, per modified Bruce protocol, achieving 4.60 METS.   Resting HR was 37 bpm that rose to 116 bpm, represents 85% MAPHR. No evidence of chronotropic incompetence.   Moderately impaired exercise capacity and elevated BP response (135/77 to 180/98 mm Hg).   Baseline EKG showed AFib. Stress ECG is negative for ischemia and arrhythmias (had occasional PVCs).  Assessment and Plan:  1.  Permanent atrial fibrillation with CHA2DS2-VASc score of 3.  Slow ventricular response in the absence of AV nodal blockers reflective of conduction system disease.  He has been remarkably stable in terms of symptoms as discussed above.  Cardiac monitor and GXT from October 2025 noted above.  He did not have clear evidence of chronotropic incompetence (achieved 85% MPHR), however demonstrated predominantly (but not exclusively) nocturnal pauses ranging from 2 to 4 seconds.  I have referred him for formal EP consultation, still pending.  Continue Eliquis  2.5 mg twice daily.   2.  Primary hypertension.  No changes made to current regimen.  Continues on Norvasc  5 mg daily and lisinopril  20 mg daily.  Disposition:  Follow up 6 months.  Signed, Travis Santos, M.D., F.A.C.C.  [facility] at [facility]

## 2024-09-16 NOTE — Patient Instructions (Addendum)

## 2024-10-16 ENCOUNTER — Ambulatory Visit: Admitting: Cardiovascular Disease
